# Patient Record
Sex: Female | Born: 1940 | Race: White | Hispanic: No | Smoking: Never smoker
Health system: Southern US, Community
[De-identification: ages and names within clinical notes are randomized; demographics above are authoritative.]

## PROBLEM LIST (undated history)

## (undated) DIAGNOSIS — C50919 Malignant neoplasm of unspecified site of unspecified female breast: Secondary | ICD-10-CM

## (undated) DIAGNOSIS — E785 Hyperlipidemia, unspecified: Secondary | ICD-10-CM

## (undated) DIAGNOSIS — F32A Depression, unspecified: Secondary | ICD-10-CM

## (undated) DIAGNOSIS — M1990 Unspecified osteoarthritis, unspecified site: Secondary | ICD-10-CM

## (undated) DIAGNOSIS — I10 Essential (primary) hypertension: Secondary | ICD-10-CM

## (undated) DIAGNOSIS — E119 Type 2 diabetes mellitus without complications: Secondary | ICD-10-CM

## (undated) DIAGNOSIS — M8580 Other specified disorders of bone density and structure, unspecified site: Secondary | ICD-10-CM

## (undated) DIAGNOSIS — K635 Polyp of colon: Secondary | ICD-10-CM

## (undated) HISTORY — DX: Unspecified osteoarthritis, unspecified site: M19.90

## (undated) HISTORY — DX: Other specified disorders of bone density and structure, unspecified site: M85.80

## (undated) HISTORY — DX: Essential (primary) hypertension: I10

## (undated) HISTORY — DX: Hyperlipidemia, unspecified: E78.5

## (undated) HISTORY — PX: APPENDECTOMY: SHX54

## (undated) HISTORY — PX: POLYPECTOMY: SHX149

## (undated) HISTORY — DX: Polyp of colon: K63.5

## (undated) HISTORY — DX: Type 2 diabetes mellitus without complications: E11.9

## (undated) HISTORY — PX: EYE SURGERY: SHX253

## (undated) HISTORY — DX: Malignant neoplasm of unspecified site of unspecified female breast: C50.919

---

## 2003-12-04 DIAGNOSIS — K635 Polyp of colon: Secondary | ICD-10-CM

## 2003-12-04 HISTORY — DX: Polyp of colon: K63.5

## 2004-09-28 ENCOUNTER — Ambulatory Visit: Payer: Self-pay | Admitting: Occupational Therapy

## 2004-11-04 ENCOUNTER — Ambulatory Visit: Payer: Self-pay | Admitting: Internal Medicine

## 2005-12-03 DIAGNOSIS — Z923 Personal history of irradiation: Secondary | ICD-10-CM

## 2005-12-03 DIAGNOSIS — C50919 Malignant neoplasm of unspecified site of unspecified female breast: Secondary | ICD-10-CM

## 2005-12-03 HISTORY — DX: Personal history of irradiation: Z92.3

## 2005-12-03 HISTORY — DX: Malignant neoplasm of unspecified site of unspecified female breast: C50.919

## 2005-12-03 HISTORY — PX: BREAST LUMPECTOMY: SHX2

## 2006-03-28 ENCOUNTER — Ambulatory Visit: Payer: Self-pay | Admitting: Nurse Practitioner

## 2006-04-08 ENCOUNTER — Ambulatory Visit: Payer: Self-pay | Admitting: Nurse Practitioner

## 2006-04-19 ENCOUNTER — Encounter (INDEPENDENT_AMBULATORY_CARE_PROVIDER_SITE_OTHER): Payer: Self-pay | Admitting: Specialist

## 2006-04-19 ENCOUNTER — Encounter: Admission: RE | Admit: 2006-04-19 | Discharge: 2006-04-19 | Payer: Self-pay | Admitting: Nurse Practitioner

## 2006-04-19 ENCOUNTER — Encounter (INDEPENDENT_AMBULATORY_CARE_PROVIDER_SITE_OTHER): Payer: Self-pay | Admitting: Radiology

## 2006-04-26 ENCOUNTER — Encounter: Admission: RE | Admit: 2006-04-26 | Discharge: 2006-04-26 | Payer: Self-pay | Admitting: General Surgery

## 2006-04-30 ENCOUNTER — Ambulatory Visit (HOSPITAL_BASED_OUTPATIENT_CLINIC_OR_DEPARTMENT_OTHER): Admission: RE | Admit: 2006-04-30 | Discharge: 2006-04-30 | Payer: Self-pay | Admitting: General Surgery

## 2006-04-30 ENCOUNTER — Encounter (INDEPENDENT_AMBULATORY_CARE_PROVIDER_SITE_OTHER): Payer: Self-pay | Admitting: *Deleted

## 2006-05-01 ENCOUNTER — Ambulatory Visit: Payer: Self-pay | Admitting: Oncology

## 2006-05-08 LAB — COMPREHENSIVE METABOLIC PANEL
ALT: 19 U/L (ref 0–40)
AST: 13 U/L (ref 0–37)
Albumin: 4.3 g/dL (ref 3.5–5.2)
CO2: 27 mEq/L (ref 19–32)
Calcium: 9.5 mg/dL (ref 8.4–10.5)
Chloride: 105 mEq/L (ref 96–112)
Potassium: 4 mEq/L (ref 3.5–5.3)
Sodium: 142 mEq/L (ref 135–145)
Total Protein: 6.7 g/dL (ref 6.0–8.3)

## 2006-05-08 LAB — CBC WITH DIFFERENTIAL/PLATELET
BASO%: 0.7 % (ref 0.0–2.0)
EOS%: 1.7 % (ref 0.0–7.0)
HCT: 43.6 % (ref 34.8–46.6)
MCHC: 33.8 g/dL (ref 32.0–36.0)
MONO#: 0.5 10*3/uL (ref 0.1–0.9)
NEUT%: 58.1 % (ref 39.6–76.8)
RBC: 4.87 10*6/uL (ref 3.70–5.32)
RDW: 13.2 % (ref 11.3–14.5)
WBC: 10 10*3/uL (ref 3.9–10.0)
lymph#: 3.4 10*3/uL — ABNORMAL HIGH (ref 0.9–3.3)

## 2006-05-08 LAB — CANCER ANTIGEN 27.29: CA 27.29: 12 U/mL (ref 0–39)

## 2006-05-13 ENCOUNTER — Ambulatory Visit (HOSPITAL_COMMUNITY): Admission: RE | Admit: 2006-05-13 | Discharge: 2006-05-13 | Payer: Self-pay | Admitting: Oncology

## 2006-05-14 ENCOUNTER — Encounter: Admission: RE | Admit: 2006-05-14 | Discharge: 2006-05-14 | Payer: Self-pay | Admitting: Oncology

## 2006-05-16 ENCOUNTER — Ambulatory Visit: Admission: RE | Admit: 2006-05-16 | Discharge: 2006-06-07 | Payer: Self-pay | Admitting: *Deleted

## 2006-05-20 ENCOUNTER — Ambulatory Visit (HOSPITAL_COMMUNITY): Admission: RE | Admit: 2006-05-20 | Discharge: 2006-05-20 | Payer: Self-pay | Admitting: Oncology

## 2006-06-06 LAB — CBC WITH DIFFERENTIAL/PLATELET
BASO%: 1.3 % (ref 0.0–2.0)
EOS%: 0.4 % (ref 0.0–7.0)
LYMPH%: 35 % (ref 14.0–48.0)
MCH: 30.4 pg (ref 26.0–34.0)
MCHC: 34.3 g/dL (ref 32.0–36.0)
MONO#: 0.5 10*3/uL (ref 0.1–0.9)
Platelets: 246 10*3/uL (ref 145–400)
RBC: 4.83 10*6/uL (ref 3.70–5.32)
WBC: 8.8 10*3/uL (ref 3.9–10.0)
lymph#: 3.1 10*3/uL (ref 0.9–3.3)

## 2006-06-08 ENCOUNTER — Encounter: Admission: RE | Admit: 2006-06-08 | Discharge: 2006-06-08 | Payer: Self-pay | Admitting: Oncology

## 2006-06-10 ENCOUNTER — Ambulatory Visit: Payer: Self-pay | Admitting: Oncology

## 2006-06-19 ENCOUNTER — Ambulatory Visit: Admission: RE | Admit: 2006-06-19 | Discharge: 2006-09-17 | Payer: Self-pay | Admitting: *Deleted

## 2006-09-05 ENCOUNTER — Ambulatory Visit: Payer: Self-pay | Admitting: Oncology

## 2007-01-03 ENCOUNTER — Ambulatory Visit: Payer: Self-pay | Admitting: Oncology

## 2007-01-07 LAB — COMPREHENSIVE METABOLIC PANEL
AST: 21 U/L (ref 0–37)
Albumin: 4.5 g/dL (ref 3.5–5.2)
Alkaline Phosphatase: 90 U/L (ref 39–117)
BUN: 16 mg/dL (ref 6–23)
Glucose, Bld: 128 mg/dL — ABNORMAL HIGH (ref 70–99)
Potassium: 3.6 mEq/L (ref 3.5–5.3)
Sodium: 142 mEq/L (ref 135–145)
Total Bilirubin: 0.7 mg/dL (ref 0.3–1.2)
Total Protein: 7 g/dL (ref 6.0–8.3)

## 2007-01-07 LAB — CBC WITH DIFFERENTIAL/PLATELET
EOS%: 0.3 % (ref 0.0–7.0)
Eosinophils Absolute: 0 10*3/uL (ref 0.0–0.5)
LYMPH%: 28.4 % (ref 14.0–48.0)
MCH: 30.9 pg (ref 26.0–34.0)
MCV: 87.6 fL (ref 81.0–101.0)
MONO%: 5.8 % (ref 0.0–13.0)
Platelets: 244 10*3/uL (ref 145–400)
RBC: 4.84 10*6/uL (ref 3.70–5.32)
RDW: 13.3 % (ref 11.3–14.5)

## 2007-01-07 LAB — CANCER ANTIGEN 27.29: CA 27.29: 16 U/mL (ref 0–39)

## 2007-03-31 ENCOUNTER — Ambulatory Visit: Payer: Self-pay

## 2007-05-05 ENCOUNTER — Ambulatory Visit: Payer: Self-pay | Admitting: Oncology

## 2007-05-07 LAB — COMPREHENSIVE METABOLIC PANEL
ALT: 19 U/L (ref 0–35)
BUN: 13 mg/dL (ref 6–23)
CO2: 29 mEq/L (ref 19–32)
Calcium: 9.8 mg/dL (ref 8.4–10.5)
Chloride: 104 mEq/L (ref 96–112)
Creatinine, Ser: 0.72 mg/dL (ref 0.40–1.20)
Glucose, Bld: 131 mg/dL — ABNORMAL HIGH (ref 70–99)
Total Bilirubin: 0.6 mg/dL (ref 0.3–1.2)

## 2007-05-07 LAB — CBC WITH DIFFERENTIAL/PLATELET
Basophils Absolute: 0 10*3/uL (ref 0.0–0.1)
HCT: 40.5 % (ref 34.8–46.6)
HGB: 14.2 g/dL (ref 11.6–15.9)
LYMPH%: 30.1 % (ref 14.0–48.0)
MONO#: 0.5 10*3/uL (ref 0.1–0.9)
NEUT%: 62.7 % (ref 39.6–76.8)
Platelets: 209 10*3/uL (ref 145–400)
WBC: 7.9 10*3/uL (ref 3.9–10.0)
lymph#: 2.4 10*3/uL (ref 0.9–3.3)

## 2007-05-07 LAB — LACTATE DEHYDROGENASE: LDH: 156 U/L (ref 94–250)

## 2007-05-07 LAB — CANCER ANTIGEN 27.29: CA 27.29: 13 U/mL (ref 0–39)

## 2007-08-14 ENCOUNTER — Ambulatory Visit: Payer: Self-pay | Admitting: Orthopedic Surgery

## 2007-09-12 ENCOUNTER — Ambulatory Visit: Payer: Self-pay | Admitting: Oncology

## 2007-09-16 LAB — COMPREHENSIVE METABOLIC PANEL
AST: 14 U/L (ref 0–37)
Albumin: 4.5 g/dL (ref 3.5–5.2)
Alkaline Phosphatase: 77 U/L (ref 39–117)
Chloride: 99 mEq/L (ref 96–112)
Glucose, Bld: 102 mg/dL — ABNORMAL HIGH (ref 70–99)
Potassium: 3.7 mEq/L (ref 3.5–5.3)
Sodium: 139 mEq/L (ref 135–145)
Total Protein: 6.9 g/dL (ref 6.0–8.3)

## 2007-09-16 LAB — CBC WITH DIFFERENTIAL/PLATELET
Eosinophils Absolute: 0 10*3/uL (ref 0.0–0.5)
MCV: 88.9 fL (ref 81.0–101.0)
MONO%: 5.6 % (ref 0.0–13.0)
NEUT#: 4.8 10*3/uL (ref 1.5–6.5)
RBC: 4.55 10*6/uL (ref 3.70–5.32)
RDW: 13.2 % (ref 11.3–14.5)
WBC: 7.5 10*3/uL (ref 3.9–10.0)
lymph#: 2.1 10*3/uL (ref 0.9–3.3)

## 2007-09-16 LAB — LACTATE DEHYDROGENASE: LDH: 179 U/L (ref 94–250)

## 2008-01-12 ENCOUNTER — Ambulatory Visit: Payer: Self-pay | Admitting: Oncology

## 2008-01-14 LAB — CBC WITH DIFFERENTIAL/PLATELET
BASO%: 0.3 % (ref 0.0–2.0)
HCT: 42 % (ref 34.8–46.6)
HGB: 14.5 g/dL (ref 11.6–15.9)
MCHC: 34.5 g/dL (ref 32.0–36.0)
MONO#: 0.3 10*3/uL (ref 0.1–0.9)
NEUT%: 68.4 % (ref 39.6–76.8)
RDW: 13.7 % (ref 11.3–14.5)
WBC: 8.1 10*3/uL (ref 3.9–10.0)
lymph#: 2.2 10*3/uL (ref 0.9–3.3)

## 2008-01-15 LAB — VITAMIN D 25 HYDROXY (VIT D DEFICIENCY, FRACTURES): Vit D, 25-Hydroxy: 38 ng/mL (ref 30–89)

## 2008-01-15 LAB — CANCER ANTIGEN 27.29: CA 27.29: 22 U/mL (ref 0–39)

## 2008-01-15 LAB — COMPREHENSIVE METABOLIC PANEL
ALT: 21 U/L (ref 0–35)
AST: 19 U/L (ref 0–37)
Albumin: 4.5 g/dL (ref 3.5–5.2)
Alkaline Phosphatase: 93 U/L (ref 39–117)
BUN: 18 mg/dL (ref 6–23)
CO2: 27 mEq/L (ref 19–32)
Calcium: 10.3 mg/dL (ref 8.4–10.5)
Chloride: 99 mEq/L (ref 96–112)
Creatinine, Ser: 0.87 mg/dL (ref 0.40–1.20)
Glucose, Bld: 125 mg/dL — ABNORMAL HIGH (ref 70–99)
Potassium: 3.8 mEq/L (ref 3.5–5.3)
Sodium: 141 mEq/L (ref 135–145)
Total Bilirubin: 0.6 mg/dL (ref 0.3–1.2)
Total Protein: 7 g/dL (ref 6.0–8.3)

## 2008-01-15 LAB — LACTATE DEHYDROGENASE: LDH: 168 U/L (ref 94–250)

## 2008-01-20 LAB — VITAMIN D 1,25 DIHYDROXY: Vit D, 1,25-Dihydroxy: 27 pg/mL (ref 6–62)

## 2008-05-11 ENCOUNTER — Ambulatory Visit: Payer: Self-pay | Admitting: Oncology

## 2008-05-14 LAB — CBC WITH DIFFERENTIAL/PLATELET
Basophils Absolute: 0 10*3/uL (ref 0.0–0.1)
Eosinophils Absolute: 0.1 10*3/uL (ref 0.0–0.5)
HCT: 43 % (ref 34.8–46.6)
HGB: 15 g/dL (ref 11.6–15.9)
MCV: 88.8 fL (ref 81.0–101.0)
MONO%: 6.5 % (ref 0.0–13.0)
NEUT#: 4.5 10*3/uL (ref 1.5–6.5)
NEUT%: 62 % (ref 39.6–76.8)
RDW: 13 % (ref 11.3–14.5)

## 2008-05-17 LAB — VITAMIN D 25 HYDROXY (VIT D DEFICIENCY, FRACTURES): Vit D, 25-Hydroxy: 46 ng/mL (ref 30–89)

## 2008-05-17 LAB — COMPREHENSIVE METABOLIC PANEL
Albumin: 4.5 g/dL (ref 3.5–5.2)
Alkaline Phosphatase: 88 U/L (ref 39–117)
BUN: 18 mg/dL (ref 6–23)
Calcium: 9.9 mg/dL (ref 8.4–10.5)
Chloride: 103 mEq/L (ref 96–112)
Creatinine, Ser: 0.65 mg/dL (ref 0.40–1.20)
Glucose, Bld: 143 mg/dL — ABNORMAL HIGH (ref 70–99)
Potassium: 3.7 mEq/L (ref 3.5–5.3)

## 2008-05-17 LAB — CANCER ANTIGEN 27.29: CA 27.29: 24 U/mL (ref 0–39)

## 2008-05-18 ENCOUNTER — Encounter: Admission: RE | Admit: 2008-05-18 | Discharge: 2008-05-18 | Payer: Self-pay | Admitting: Oncology

## 2008-09-15 ENCOUNTER — Ambulatory Visit: Payer: Self-pay | Admitting: Oncology

## 2008-09-17 LAB — CBC WITH DIFFERENTIAL/PLATELET
BASO%: 0.6 % (ref 0.0–2.0)
HCT: 43.3 % (ref 34.8–46.6)
LYMPH%: 35.8 % (ref 14.0–48.0)
MCH: 31 pg (ref 26.0–34.0)
MCHC: 34.6 g/dL (ref 32.0–36.0)
MCV: 89.4 fL (ref 81.0–101.0)
MONO#: 0.4 10*3/uL (ref 0.1–0.9)
NEUT%: 57.1 % (ref 39.6–76.8)
Platelets: 235 10*3/uL (ref 145–400)
WBC: 7.7 10*3/uL (ref 3.9–10.0)

## 2008-09-20 LAB — LACTATE DEHYDROGENASE: LDH: 168 U/L (ref 94–250)

## 2008-09-20 LAB — COMPREHENSIVE METABOLIC PANEL
ALT: 18 U/L (ref 0–35)
CO2: 27 mEq/L (ref 19–32)
Creatinine, Ser: 0.7 mg/dL (ref 0.40–1.20)
Total Bilirubin: 0.6 mg/dL (ref 0.3–1.2)

## 2008-09-20 LAB — CANCER ANTIGEN 27.29: CA 27.29: 25 U/mL (ref 0–39)

## 2009-03-23 ENCOUNTER — Ambulatory Visit: Payer: Self-pay | Admitting: Oncology

## 2009-03-25 LAB — CBC WITH DIFFERENTIAL/PLATELET
BASO%: 0.4 % (ref 0.0–2.0)
Basophils Absolute: 0 10e3/uL (ref 0.0–0.1)
EOS%: 0.6 % (ref 0.0–7.0)
Eosinophils Absolute: 0.1 10e3/uL (ref 0.0–0.5)
HCT: 43.3 % (ref 34.8–46.6)
HGB: 14.8 g/dL (ref 11.6–15.9)
LYMPH%: 32.1 % (ref 14.0–49.7)
MCH: 30.8 pg (ref 25.1–34.0)
MCHC: 34.3 g/dL (ref 31.5–36.0)
MCV: 89.8 fL (ref 79.5–101.0)
MONO#: 0.5 10e3/uL (ref 0.1–0.9)
MONO%: 5 % (ref 0.0–14.0)
NEUT#: 5.7 10e3/uL (ref 1.5–6.5)
NEUT%: 61.9 % (ref 38.4–76.8)
Platelets: 227 10e3/uL (ref 145–400)
RBC: 4.82 10e6/uL (ref 3.70–5.45)
RDW: 13.5 % (ref 11.2–14.5)
WBC: 9.2 10e3/uL (ref 3.9–10.3)
lymph#: 3 10e3/uL (ref 0.9–3.3)

## 2009-03-28 LAB — COMPREHENSIVE METABOLIC PANEL
ALT: 33 U/L (ref 0–35)
BUN: 14 mg/dL (ref 6–23)
CO2: 29 mEq/L (ref 19–32)
Creatinine, Ser: 0.88 mg/dL (ref 0.40–1.20)
Total Bilirubin: 0.3 mg/dL (ref 0.3–1.2)

## 2009-03-28 LAB — CANCER ANTIGEN 27.29: CA 27.29: 24 U/mL (ref 0–39)

## 2009-03-28 LAB — LACTATE DEHYDROGENASE: LDH: 180 U/L (ref 94–250)

## 2009-05-23 ENCOUNTER — Encounter: Admission: RE | Admit: 2009-05-23 | Discharge: 2009-05-23 | Payer: Self-pay | Admitting: Oncology

## 2009-12-03 HISTORY — PX: VAGINAL HYSTERECTOMY: SUR661

## 2009-12-09 ENCOUNTER — Other Ambulatory Visit: Admission: RE | Admit: 2009-12-09 | Discharge: 2009-12-09 | Payer: Self-pay | Admitting: Obstetrics and Gynecology

## 2010-03-01 ENCOUNTER — Encounter: Payer: Self-pay | Admitting: Obstetrics & Gynecology

## 2010-03-01 ENCOUNTER — Inpatient Hospital Stay (HOSPITAL_COMMUNITY): Admission: RE | Admit: 2010-03-01 | Discharge: 2010-03-03 | Payer: Self-pay | Admitting: Obstetrics & Gynecology

## 2010-03-23 ENCOUNTER — Ambulatory Visit: Payer: Self-pay | Admitting: Oncology

## 2010-03-24 LAB — CBC WITH DIFFERENTIAL/PLATELET
BASO%: 0.4 % (ref 0.0–2.0)
Eosinophils Absolute: 0.2 10*3/uL (ref 0.0–0.5)
HCT: 37.1 % (ref 34.8–46.6)
MCH: 31.5 pg (ref 25.1–34.0)
MCHC: 34 g/dL (ref 31.5–36.0)
NEUT#: 6.8 10*3/uL — ABNORMAL HIGH (ref 1.5–6.5)
Platelets: 298 10*3/uL (ref 145–400)
RDW: 13.6 % (ref 11.2–14.5)

## 2010-03-24 LAB — CANCER ANTIGEN 27.29: CA 27.29: 12 U/mL (ref 0–39)

## 2010-03-24 LAB — COMPREHENSIVE METABOLIC PANEL
BUN: 11 mg/dL (ref 6–23)
Chloride: 104 mEq/L (ref 96–112)
Creatinine, Ser: 0.65 mg/dL (ref 0.40–1.20)
Glucose, Bld: 101 mg/dL — ABNORMAL HIGH (ref 70–99)
Potassium: 4.1 mEq/L (ref 3.5–5.3)
Sodium: 143 mEq/L (ref 135–145)
Total Protein: 6.4 g/dL (ref 6.0–8.3)

## 2010-03-24 LAB — LACTATE DEHYDROGENASE: LDH: 110 U/L (ref 94–250)

## 2010-06-02 ENCOUNTER — Encounter: Admission: RE | Admit: 2010-06-02 | Discharge: 2010-06-02 | Payer: Self-pay | Admitting: Oncology

## 2011-02-21 LAB — CBC
HCT: 28.7 % — ABNORMAL LOW (ref 36.0–46.0)
Hemoglobin: 10.3 g/dL — ABNORMAL LOW (ref 12.0–15.0)
MCV: 90.5 fL (ref 78.0–100.0)
RBC: 3.17 MIL/uL — ABNORMAL LOW (ref 3.87–5.11)

## 2011-02-21 LAB — DIFFERENTIAL
Lymphs Abs: 1.2 10*3/uL (ref 0.7–4.0)
Monocytes Absolute: 0.6 10*3/uL (ref 0.1–1.0)
Neutro Abs: 9.8 10*3/uL — ABNORMAL HIGH (ref 1.7–7.7)

## 2011-02-26 LAB — COMPREHENSIVE METABOLIC PANEL
ALT: 21 U/L (ref 0–35)
AST: 17 U/L (ref 0–37)
Alkaline Phosphatase: 61 U/L (ref 39–117)
CO2: 29 mEq/L (ref 19–32)
Creatinine, Ser: 0.65 mg/dL (ref 0.4–1.2)
GFR calc Af Amer: >60 mL/min (ref >60)
Glucose, Bld: 104 mg/dL — ABNORMAL HIGH (ref 70–99)
Potassium: 4.1 mEq/L (ref 3.5–5.1)
Total Protein: 6.3 g/dL (ref 6.0–8.3)

## 2011-02-26 LAB — URINALYSIS, ROUTINE W REFLEX MICROSCOPIC
Hgb urine dipstick: NEGATIVE
Ketones, ur: NEGATIVE mg/dL
Specific Gravity, Urine: 1.015 (ref 1.005–1.030)
Urobilinogen, UA: 0.2 mg/dL (ref 0.0–1.0)

## 2011-02-26 LAB — DIFFERENTIAL
Basophils Absolute: 0 10*3/uL (ref 0.0–0.1)
Eosinophils Absolute: 0 10*3/uL (ref 0.0–0.7)
Eosinophils Relative: 0 % (ref 0–5)
Lymphocytes Relative: 9 % — ABNORMAL LOW (ref 12–46)

## 2011-02-26 LAB — ABO/RH: ABO/RH(D): O POS

## 2011-02-26 LAB — CBC
HCT: 28.4 % — ABNORMAL LOW (ref 36.0–46.0)
HCT: 41 % (ref 36.0–46.0)
Hemoglobin: 14.3 g/dL (ref 12.0–15.0)
MCV: 91.1 fL (ref 78.0–100.0)
Platelets: 155 10*3/uL (ref 150–400)
RDW: 13.3 % (ref 11.5–15.5)
RDW: 13.4 % (ref 11.5–15.5)
WBC: 8.9 10*3/uL (ref 4.0–10.5)

## 2011-02-26 LAB — CROSSMATCH

## 2011-02-26 LAB — GLUCOSE, CAPILLARY: Glucose-Capillary: 118 mg/dL — ABNORMAL HIGH (ref 70–99)

## 2011-04-10 ENCOUNTER — Other Ambulatory Visit: Payer: Self-pay | Admitting: Oncology

## 2011-04-10 ENCOUNTER — Encounter (HOSPITAL_BASED_OUTPATIENT_CLINIC_OR_DEPARTMENT_OTHER): Payer: Medicare Other | Admitting: Oncology

## 2011-04-10 ENCOUNTER — Ambulatory Visit: Payer: Self-pay | Admitting: Gastroenterology

## 2011-04-10 ENCOUNTER — Ambulatory Visit: Payer: Self-pay | Admitting: Urgent Care

## 2011-04-10 DIAGNOSIS — C50919 Malignant neoplasm of unspecified site of unspecified female breast: Secondary | ICD-10-CM

## 2011-04-10 DIAGNOSIS — Z170 Estrogen receptor positive status [ER+]: Secondary | ICD-10-CM

## 2011-04-10 LAB — COMPREHENSIVE METABOLIC PANEL
ALT: 18 U/L (ref 0–35)
CO2: 26 mEq/L (ref 19–32)
Calcium: 10 mg/dL (ref 8.4–10.5)
Chloride: 102 mEq/L (ref 96–112)
Potassium: 3.8 mEq/L (ref 3.5–5.3)
Sodium: 140 mEq/L (ref 135–145)
Total Protein: 6.5 g/dL (ref 6.0–8.3)

## 2011-04-10 LAB — CBC WITH DIFFERENTIAL/PLATELET
BASO%: 0.2 % (ref 0.0–2.0)
HCT: 40.6 % (ref 34.8–46.6)
MCHC: 34 g/dL (ref 31.5–36.0)
MONO#: 0.5 10*3/uL (ref 0.1–0.9)
RBC: 4.47 10*6/uL (ref 3.70–5.45)
WBC: 8.2 10*3/uL (ref 3.9–10.3)
lymph#: 2.3 10*3/uL (ref 0.9–3.3)

## 2011-04-10 LAB — LACTATE DEHYDROGENASE: LDH: 194 U/L (ref 94–250)

## 2011-04-17 ENCOUNTER — Ambulatory Visit (INDEPENDENT_AMBULATORY_CARE_PROVIDER_SITE_OTHER): Payer: PRIVATE HEALTH INSURANCE | Admitting: Gastroenterology

## 2011-04-17 ENCOUNTER — Encounter: Payer: Self-pay | Admitting: Gastroenterology

## 2011-04-17 VITALS — BP 129/72 | HR 71 | Temp 98.1°F | Ht 62.0 in | Wt 158.6 lb

## 2011-04-17 DIAGNOSIS — Z86010 Personal history of colonic polyps: Secondary | ICD-10-CM

## 2011-04-17 DIAGNOSIS — Z853 Personal history of malignant neoplasm of breast: Secondary | ICD-10-CM

## 2011-04-17 MED ORDER — PEG 3350-KCL-NA BICARB-NACL 420 G PO SOLR: ORAL | Status: AC

## 2011-04-17 NOTE — Assessment & Plan Note (Signed)
History of colonic polyps in 2005. Personal history of breast cancer. Sister with history of other female cancer. Recommend surveillance colonoscopy at this time. I have discussed the risks, alternatives, benefits with regards to but not limited to the risk of reaction to medication, bleeding, infection, perforation and the patient is agreeable to proceed. Written consent to be obtained.

## 2011-04-17 NOTE — Progress Notes (Signed)
Primary Care Physician:  Ninfa Linden, NP, NP  Primary Gastroenterologist:  Roetta Sessions, MD  Chief Complaint  Patient presents with  . Colonoscopy    h/o colon polyps    HPI:  Kalynn Declercq is a 70 y.o. female here to schedule surveillance colonoscopy. She had a colonoscopy in 2005 at which time she was found to have 3 colon polyps. Procedure was done at [facility]. She has chronic constipation which she manages with her diet. Denies any melena, rectal bleeding, abdominal pain, nausea, vomiting, weight loss, anorexia, heartburn, dysphagia.  Current Outpatient Prescriptions  Medication Sig Dispense Refill  . alendronate (FOSAMAX) 70 MG tablet Take 70 mg by mouth every 7 (seven) days. Take with a full glass of water on an empty stomach.       Marland Kitchen anastrozole (ARIMIDEX) 1 MG tablet Take 1 mg by mouth daily.        Marland Kitchen aspirin 325 MG tablet Take 325 mg by mouth daily.        Marland Kitchen atorvastatin (LIPITOR) 40 MG tablet Take 40 mg by mouth daily.        . Calcium Carbonate (CALTRATE 600 PO) Take by mouth.        Marland Kitchen CALCIUM-VITAMIN D PO Take by mouth.        . enalapril (VASOTEC) 5 MG tablet Take 5 mg by mouth daily.        . metFORMIN (GLUCOPHAGE) 500 MG tablet Take 500 mg by mouth 2 (two) times daily with a meal.        . oxybutynin (DITROPAN-XL) 10 MG 24 hr tablet Take 10 mg by mouth daily.          Allergies as of 04/17/2011  . (No Known Allergies)    Past Medical History  Diagnosis Date  . HTN (hypertension)   . Hyperlipidemia   . DM (diabetes mellitus)   . Osteoarthritis   . Breast cancer 2007    lumpectomy, XRT, Arimadex  . Colon polyps 2005  . Vitamin D deficiency   . Osteopenia     Past Surgical History  Procedure Date  . Appendectomy   . Vaginal hysterectomy 2011    partial  . Breast lumpectomy 2007    Family History  Problem Relation Age of Onset  . Diabetes Mother   . Heart disease Father   . Colon cancer Neg Hx   . Liver disease Neg Hx   . Stroke Mother     . Prostate cancer Brother   . Cancer Sister     female    History   Social History  . Marital Status: Widowed    Spouse Name: N/A    Number of Children: 4  . Years of Education: N/A   Occupational History  . Orthoptist    Social History Main Topics  . Smoking status: Never Smoker   . Smokeless tobacco: Not on file  . Alcohol Use: No  . Drug Use: No  . Sexually Active: Not on file      ROS:  General: Negative for anorexia, weight loss, fever, chills, fatigue, weakness. Eyes: Negative for vision changes.  ENT: Negative for hoarseness, difficulty swallowing , nasal congestion. CV: Negative for chest pain, angina, palpitations, dyspnea on exertion, peripheral edema.  Respiratory: Negative for dyspnea at rest, dyspnea on exertion, cough, sputum, wheezing.  GI: See history of present illness. GU:  Negative for dysuria, hematuria, urinary incontinence, urinary frequency, nocturnal urination.  MS: Negative for joint pain, low back  pain.  Derm: Negative for rash or itching.  Neuro: Negative for weakness, abnormal sensation, seizure, frequent headaches, memory loss, confusion.  Psych: Negative for anxiety, depression, suicidal ideation, hallucinations.  Endo: Negative for unusual weight change.  Heme: Negative for bruising or bleeding. Allergy: Negative for rash or hives.    Physical Examination:  BP 129/72  Pulse 71  Temp(Src) 98.1 F (36.7 C) (Temporal)  Ht 5\' 2"  (1.575 m)  Wt 158 lb 9.6 oz (71.94 kg)  BMI 29.01 kg/m2   General: Well-nourished, well-developed in no acute distress.  Head: Normocephalic, atraumatic.   Eyes: Conjunctiva pink, no icterus. Mouth: Oropharyngeal mucosa moist and pink , no lesions erythema or exudate. Neck: Supple without thyromegaly, masses, or lymphadenopathy.  Lungs: Clear to auscultation bilaterally.  Heart: Regular rate and rhythm, no murmurs rubs or gallops.  Abdomen: Bowel sounds are normal, nontender, nondistended, no  hepatosplenomegaly or masses, no abdominal bruits or    hernia , no rebound or guarding.   Extremities: No lower extremity edema.  Neuro: Alert and oriented x 4 , grossly normal neurologically.  Skin: Warm and dry, no rash or jaundice.   Psych: Alert and cooperative, normal mood and affect.

## 2011-04-17 NOTE — Progress Notes (Signed)
Cc to PCP 

## 2011-04-20 NOTE — Op Note (Signed)
NAMEROBERTO, Brenda Landry                 ACCOUNT NO.:  [id number]   MEDICAL RECORD NO.:  [id number]          PATIENT TYPE:  AMB   LOCATION:  DSC                          FACILITY:  [facility]   PHYSICIAN:  Rose Phi. Maple Hudson, M.D.   DATE OF BIRTH:  [birth year]   DATE OF PROCEDURE:  DATE OF DISCHARGE:                                 OPERATIVE REPORT   PREOPERATIVE DIAGNOSIS:  Stage I carcinoma right breast.   POSTOPERATIVE DIAGNOSIS:  Stage I carcinoma right breast.   OPERATION:  1.  Blue dye injection.  2.  Right axillary sentinel lymph node biopsy.  3.  Right partial mastectomy.   SURGEON:  Dr. Francina Ames.   ANESTHESIA:  General.   OPERATIVE PROCEDURE:  Prior to coming to the operating room, 1 mCi of  technetium sulfur colloid was injected intradermally in the right breast.   After suitable general anesthesia was induced the patient placed in supine  position with the arms extended on the arm board.  The right breast was  prepped and draped in a standard fashion after having injected 5 mL of a  mixture of 2 mL of methylene blue and 3 mL of injectable saline in the  subareolar tissue and the breast gently massaged for 3 minutes.  With her  prepped and draped a short transverse axillary incision was made with  dissection through subcutaneous tissue to the clavipectoral fascia.  Deep to  the fascia was one large blue and hot lymph node which I excised as a  sentinel node.  I could not palpate any nodes.  There were no other blue or  hot nodes.   While the sentinel node was being evaluated by the pathologist, a curved  incision including an ellipse of skin was made overlying the palpable nodule  10 o'clock position.  With we then did a wide excision down to the chest  wall and then submitted this, after orienting it for the pathologist, for  touch prep evaluation of margins.   While that was all being done, I mobilized the gland off of the chest wall  and closed the deep layer with  interrupted 3-0 Vicryl and the skin with a  subcuticular 4-0 Monocryl and Steri-Strips.  The axillary incision, after  being injected with local anesthetic was also closed in two layers of 3-0  Vicryl and subcuticular 4-0 Monocryl and Steri-Strips.   Touch prep evaluation of sentinel node was negative for metastatic disease  and touch prep of the margins was very clear with the closest margin being  the deep margin at 5 mm.  Dressings were then applied and the patient  transferred to the recovery room in satisfactory condition having tolerated  the procedure well.      Rose Phi. Maple Hudson, M.D.  Electronically Signed     PRY/MEDQ  D:  04/30/2006  T:  04/30/2006  Job:  [id number]

## 2011-04-25 ENCOUNTER — Encounter: Payer: PRIVATE HEALTH INSURANCE | Admitting: Internal Medicine

## 2011-04-25 ENCOUNTER — Ambulatory Visit (HOSPITAL_COMMUNITY)
Admission: RE | Admit: 2011-04-25 | Discharge: 2011-04-25 | Disposition: A | Discharge status: Home / Self Care | Payer: PRIVATE HEALTH INSURANCE | Source: Ambulatory Visit | Attending: Internal Medicine | Admitting: Internal Medicine

## 2011-04-25 DIAGNOSIS — K5730 Diverticulosis of large intestine without perforation or abscess without bleeding: Secondary | ICD-10-CM | POA: Insufficient documentation

## 2011-04-25 DIAGNOSIS — I10 Essential (primary) hypertension: Secondary | ICD-10-CM | POA: Insufficient documentation

## 2011-04-25 DIAGNOSIS — Z860100 Personal history of colon polyps, unspecified: Secondary | ICD-10-CM | POA: Insufficient documentation

## 2011-04-25 DIAGNOSIS — E119 Type 2 diabetes mellitus without complications: Secondary | ICD-10-CM | POA: Insufficient documentation

## 2011-04-25 DIAGNOSIS — Z7982 Long term (current) use of aspirin: Secondary | ICD-10-CM | POA: Insufficient documentation

## 2011-04-25 DIAGNOSIS — Z09 Encounter for follow-up examination after completed treatment for conditions other than malignant neoplasm: Secondary | ICD-10-CM

## 2011-04-25 DIAGNOSIS — E785 Hyperlipidemia, unspecified: Secondary | ICD-10-CM | POA: Insufficient documentation

## 2011-04-25 DIAGNOSIS — Z79899 Other long term (current) drug therapy: Secondary | ICD-10-CM | POA: Insufficient documentation

## 2011-04-27 NOTE — Op Note (Signed)
  NAME:  Brenda Landry, Brenda Landry                 ACCOUNT NO.:  [id number]  MEDICAL RECORD NO.:  [id number]           PATIENT TYPE:  O  LOCATION:  DAYP                          FACILITY:  [facility]  PHYSICIAN:  R. Roetta Sessions, M.D. DATE OF BIRTH:  [birth year]  DATE OF PROCEDURE:  04/25/2011 DATE OF DISCHARGE:                              OPERATIVE REPORT   PROCEDURE:  High-risk screening colonoscopy.  INDICATIONS FOR PROCEDURE:  A 70 year old lady with a history of multiple colonies removed from her colon in 2005, down at [facility], path unknown, however.  She was told to have her early followup examination.  She has no lower GI tract symptoms at this time.  She is here now for a surveillance examination.  Risks, benefits, limitations, alternatives, and imponderables have been discussed, questions were answered.  Please see documentation medical record.  PROCEDURE NOTE:  O2 saturation, blood pressure, pulse, and respirations monitored throughout the entirety of the procedure.  CONSCIOUS SEDATION:  Versed 6 mg IV and Demerol 75 mg IV in divided doses.  INSTRUMENT:  Pentax video chip system.  FINDINGS:  Digital rectal exam revealed no abnormalities.  Endoscopic findings, prep was adequate.  Colon:  Colonic mucosa was surveyed from the rectosigmoid junction through the left transverse right colon to the appendiceal orifice, ileocecal valve/cecum.  These structures were well seen and photographed for the record.  From this level, the scope was slowly and cautiously withdrawn.  All previously mentioned mucosal surfaces were again seen.  She had extensive scattered left-sided transverse diverticula.  However, the remainder of the colonic mucosa appeared normal.  Scope was pulled down the rectum where thorough examination of the rectal mucosa including retroflex view of the anal verge demonstrated no abnormalities.  Cecal withdrawal time 12 minutes. The patient tolerated the  procedure well and was reactive in endoscopy.  IMPRESSION:  Normal rectum, left-sided transverse diverticulum, remained colonic appeared normal.  RECOMMENDATIONS: 1. Diverticulosis literature provided to Ms. Doroteo Glassman. 2. Recommended repeat colonoscopy in 5 years.     Jonathon Bellows, M.D.     RMR/MEDQ  D:  04/25/2011  T:  04/25/2011  Job:  [id number]  cc:   Ninfa Linden, FNP Fax: [phone]  Electronically Signed by Lorrin Goodell M.D. on 04/27/2011 01:48:34 PM

## 2011-06-26 ENCOUNTER — Other Ambulatory Visit: Payer: Self-pay | Admitting: Oncology

## 2011-06-26 DIAGNOSIS — Z853 Personal history of malignant neoplasm of breast: Secondary | ICD-10-CM

## 2011-07-06 ENCOUNTER — Ambulatory Visit
Admission: RE | Admit: 2011-07-06 | Discharge: 2011-07-06 | Disposition: A | Discharge status: Home / Self Care | Payer: PRIVATE HEALTH INSURANCE | Source: Ambulatory Visit | Attending: Oncology | Admitting: Oncology

## 2011-07-06 ENCOUNTER — Other Ambulatory Visit: Payer: Self-pay | Admitting: Oncology

## 2011-07-06 DIAGNOSIS — Z853 Personal history of malignant neoplasm of breast: Secondary | ICD-10-CM

## 2011-12-03 ENCOUNTER — Other Ambulatory Visit: Payer: Self-pay | Admitting: Oncology

## 2011-12-03 DIAGNOSIS — R922 Inconclusive mammogram: Secondary | ICD-10-CM

## 2012-01-14 ENCOUNTER — Ambulatory Visit
Admission: RE | Admit: 2012-01-14 | Discharge: 2012-01-14 | Disposition: A | Discharge status: Home / Self Care | Payer: PRIVATE HEALTH INSURANCE | Source: Ambulatory Visit | Attending: Oncology | Admitting: Oncology

## 2012-01-14 ENCOUNTER — Other Ambulatory Visit: Payer: Self-pay | Admitting: Oncology

## 2012-01-14 DIAGNOSIS — R922 Inconclusive mammogram: Secondary | ICD-10-CM

## 2012-04-07 ENCOUNTER — Telehealth: Payer: Self-pay | Admitting: *Deleted

## 2012-04-07 NOTE — Telephone Encounter (Signed)
per md being out of the office moved to 05-28-2012 at 2:00pm left voice message to inform the patient of the new date and time

## 2012-04-09 ENCOUNTER — Ambulatory Visit: Payer: PRIVATE HEALTH INSURANCE | Admitting: Oncology

## 2012-04-09 ENCOUNTER — Other Ambulatory Visit: Payer: PRIVATE HEALTH INSURANCE | Admitting: Lab

## 2012-05-28 ENCOUNTER — Ambulatory Visit: Payer: PRIVATE HEALTH INSURANCE | Admitting: Oncology

## 2012-05-28 ENCOUNTER — Other Ambulatory Visit: Payer: PRIVATE HEALTH INSURANCE | Admitting: Lab

## 2012-05-28 NOTE — Progress Notes (Signed)
[facility] today.  Letter mailed to patient.  

## 2012-06-10 ENCOUNTER — Other Ambulatory Visit: Payer: Self-pay | Admitting: Obstetrics & Gynecology

## 2012-06-10 DIAGNOSIS — Z853 Personal history of malignant neoplasm of breast: Secondary | ICD-10-CM

## 2012-06-10 DIAGNOSIS — Z9889 Other specified postprocedural states: Secondary | ICD-10-CM

## 2012-07-09 ENCOUNTER — Other Ambulatory Visit: Payer: Self-pay | Admitting: Obstetrics & Gynecology

## 2012-07-09 ENCOUNTER — Ambulatory Visit
Admission: RE | Admit: 2012-07-09 | Discharge: 2012-07-09 | Disposition: A | Discharge status: Home / Self Care | Payer: PRIVATE HEALTH INSURANCE | Source: Ambulatory Visit | Attending: Obstetrics & Gynecology | Admitting: Obstetrics & Gynecology

## 2012-07-09 DIAGNOSIS — Z853 Personal history of malignant neoplasm of breast: Secondary | ICD-10-CM

## 2012-07-09 DIAGNOSIS — Z9889 Other specified postprocedural states: Secondary | ICD-10-CM

## 2012-07-16 ENCOUNTER — Ambulatory Visit
Admission: RE | Admit: 2012-07-16 | Discharge: 2012-07-16 | Disposition: A | Discharge status: Home / Self Care | Payer: PRIVATE HEALTH INSURANCE | Source: Ambulatory Visit | Attending: Obstetrics & Gynecology | Admitting: Obstetrics & Gynecology

## 2012-07-16 ENCOUNTER — Other Ambulatory Visit: Payer: Self-pay | Admitting: Obstetrics & Gynecology

## 2012-07-16 DIAGNOSIS — Z9889 Other specified postprocedural states: Secondary | ICD-10-CM

## 2012-07-16 DIAGNOSIS — Z853 Personal history of malignant neoplasm of breast: Secondary | ICD-10-CM

## 2012-07-16 HISTORY — PX: BREAST BIOPSY: SHX20

## 2012-07-22 ENCOUNTER — Other Ambulatory Visit: Payer: PRIVATE HEALTH INSURANCE

## 2012-12-16 ENCOUNTER — Other Ambulatory Visit: Payer: Self-pay | Admitting: Obstetrics & Gynecology

## 2012-12-16 DIAGNOSIS — N6320 Unspecified lump in the left breast, unspecified quadrant: Secondary | ICD-10-CM

## 2013-01-08 ENCOUNTER — Ambulatory Visit
Admission: RE | Admit: 2013-01-08 | Discharge: 2013-01-08 | Disposition: A | Discharge status: Home / Self Care | Payer: PRIVATE HEALTH INSURANCE | Source: Ambulatory Visit | Attending: Obstetrics & Gynecology | Admitting: Obstetrics & Gynecology

## 2013-01-08 DIAGNOSIS — N6320 Unspecified lump in the left breast, unspecified quadrant: Secondary | ICD-10-CM

## 2013-02-10 IMAGING — MG MM DIAGNOSTIC UNILATERAL L
2 series · 2 of 2 positions shown · non-contrast
Comparison: Previous exams.

CLINICAL DATA: Evaluate clip placement following ultrasound guided
left breast biopsy.

DIGITAL DIAGNOSTIC LEFT MAMMOGRAM

[L CC]
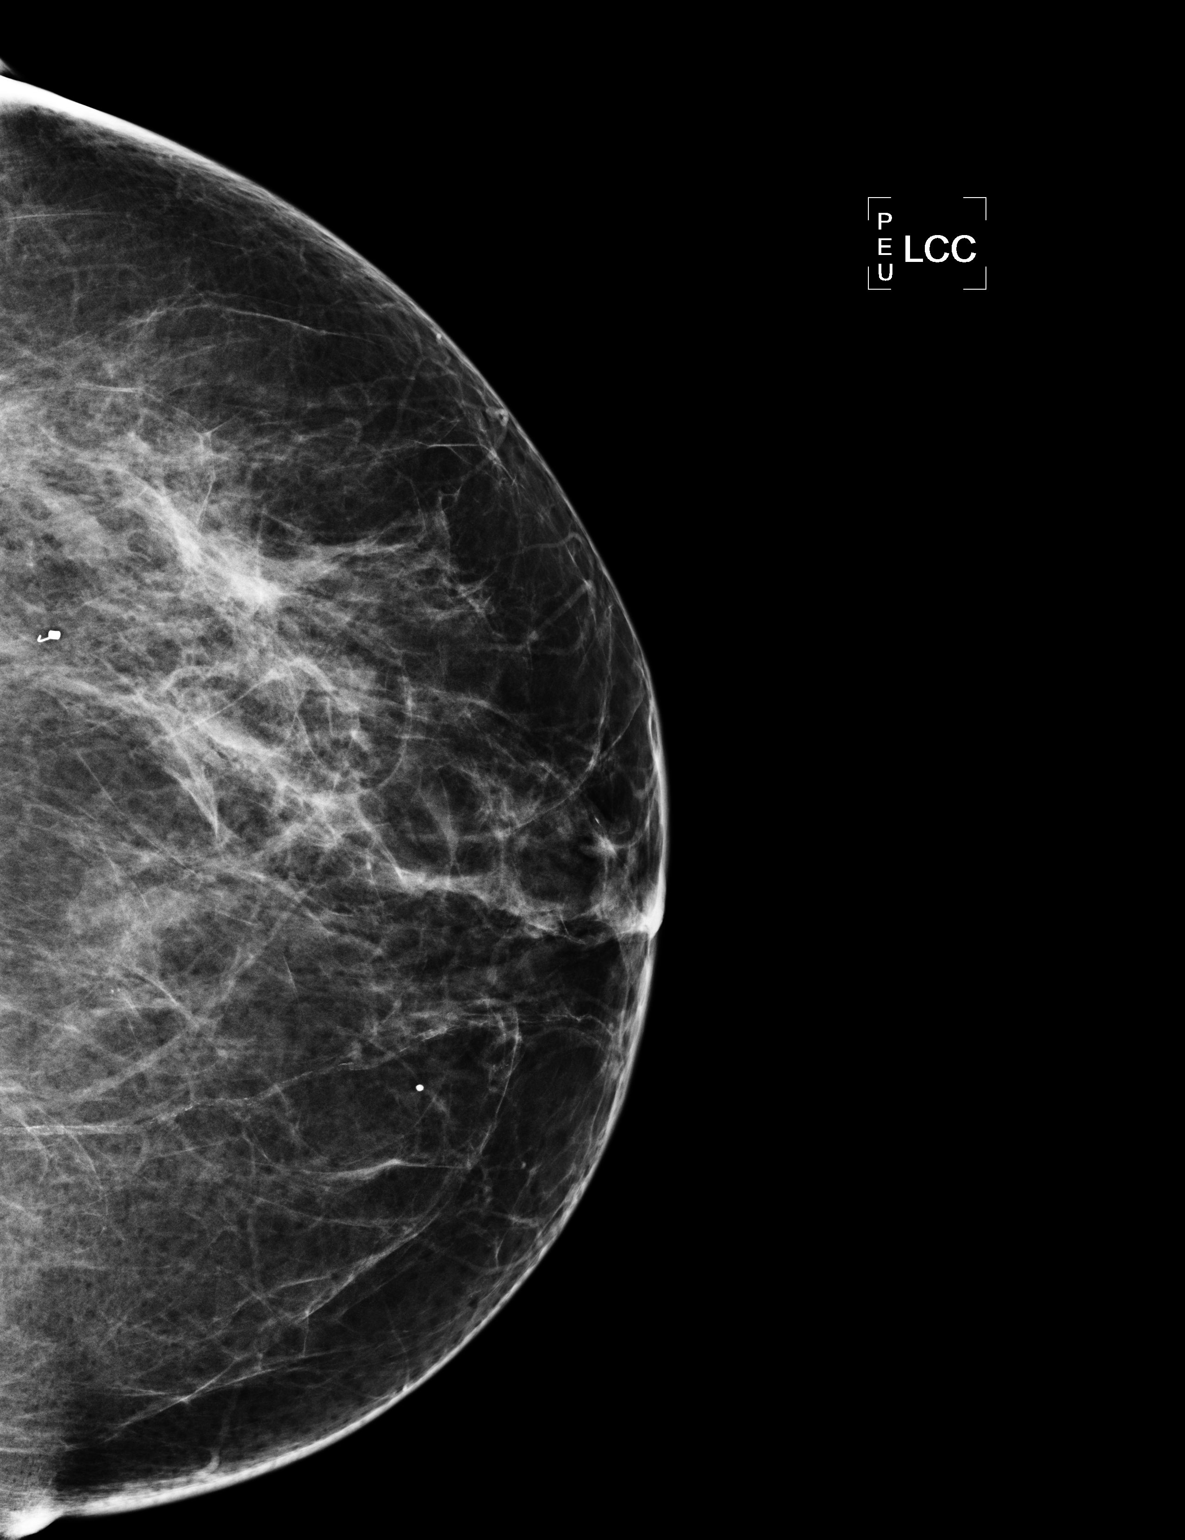

[L ML]
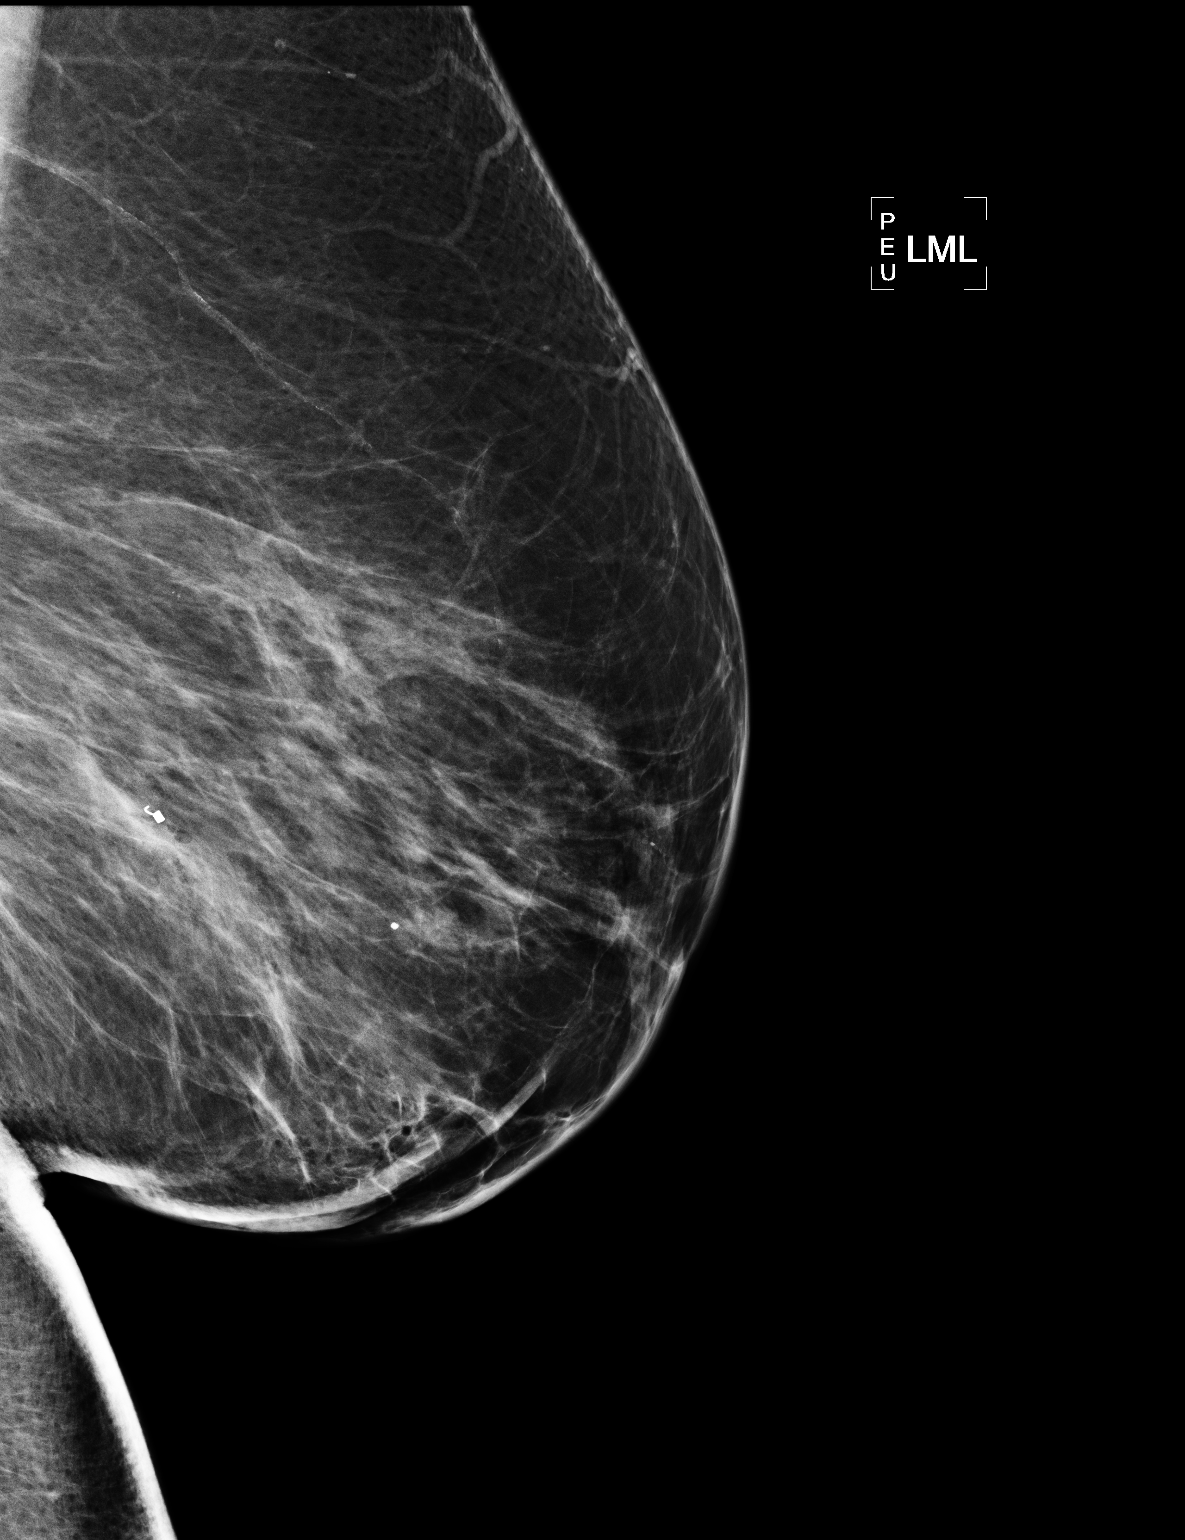

[2 of 2 positions shown; findings below may reference images not displayed]

FINDINGS: Films are performed following ultrasound guided biopsy
of the 5 x 9 x 12 mm oval hypoechoic area at the [DATE] position of
the left breast 7 cm from the nipple.  The coil shaped biopsy clip
is slightly anterior to the mammographic abnormality.
IMPRESSION: Biopsy clip slightly anterior to the mammographic abnormality.

Pathology will be followed.

## 2013-06-12 ENCOUNTER — Other Ambulatory Visit: Payer: Self-pay | Admitting: Nurse Practitioner

## 2013-06-12 DIAGNOSIS — N6320 Unspecified lump in the left breast, unspecified quadrant: Secondary | ICD-10-CM

## 2013-07-14 ENCOUNTER — Other Ambulatory Visit: Payer: Self-pay | Admitting: Nurse Practitioner

## 2013-07-14 ENCOUNTER — Ambulatory Visit
Admission: RE | Admit: 2013-07-14 | Discharge: 2013-07-14 | Disposition: A | Discharge status: Home / Self Care | Payer: PRIVATE HEALTH INSURANCE | Source: Ambulatory Visit | Attending: Nurse Practitioner | Admitting: Nurse Practitioner

## 2013-07-14 DIAGNOSIS — N6320 Unspecified lump in the left breast, unspecified quadrant: Secondary | ICD-10-CM

## 2013-07-21 ENCOUNTER — Ambulatory Visit
Admission: RE | Admit: 2013-07-21 | Discharge: 2013-07-21 | Disposition: A | Discharge status: Home / Self Care | Payer: PRIVATE HEALTH INSURANCE | Source: Ambulatory Visit | Attending: Nurse Practitioner | Admitting: Nurse Practitioner

## 2013-07-21 ENCOUNTER — Other Ambulatory Visit: Payer: Self-pay | Admitting: Nurse Practitioner

## 2013-07-21 DIAGNOSIS — N6002 Solitary cyst of left breast: Secondary | ICD-10-CM

## 2013-07-21 DIAGNOSIS — N6320 Unspecified lump in the left breast, unspecified quadrant: Secondary | ICD-10-CM

## 2014-02-08 IMAGING — MG MM DIAGNOSTIC BILATERAL
7 series · 7 of 7 positions shown · non-contrast
Comparison: Multiple priors

CLINICAL DATA: Annual examined follow-up left breast mass.

DIGITAL DIAGNOSTIC BILATERAL MAMMOGRAM WITH CAD AND LEFT BREAST
ULTRASOUND:

[R CC]
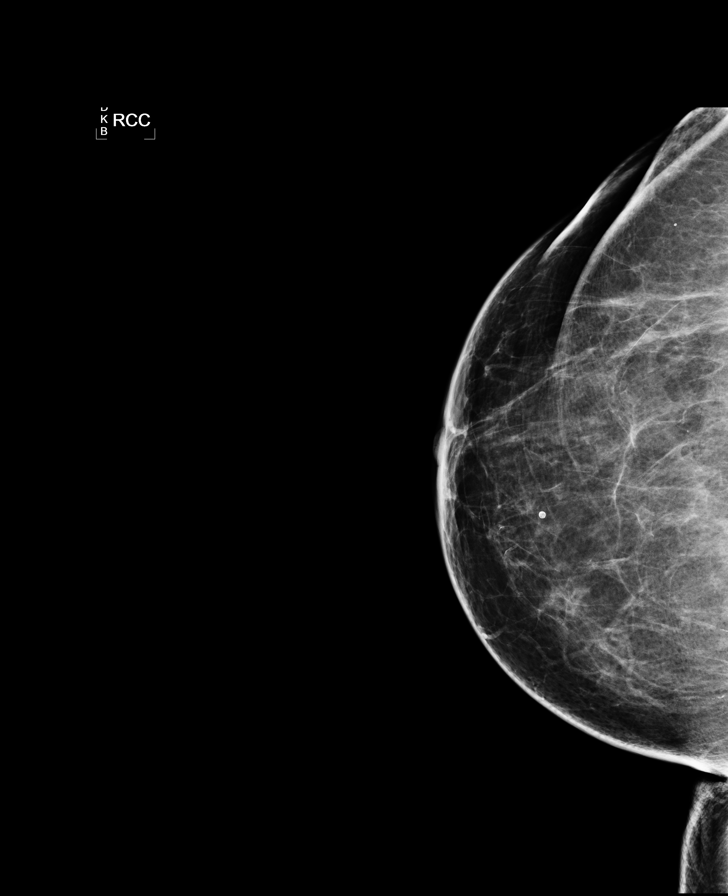

[L CC (1 of 2)]
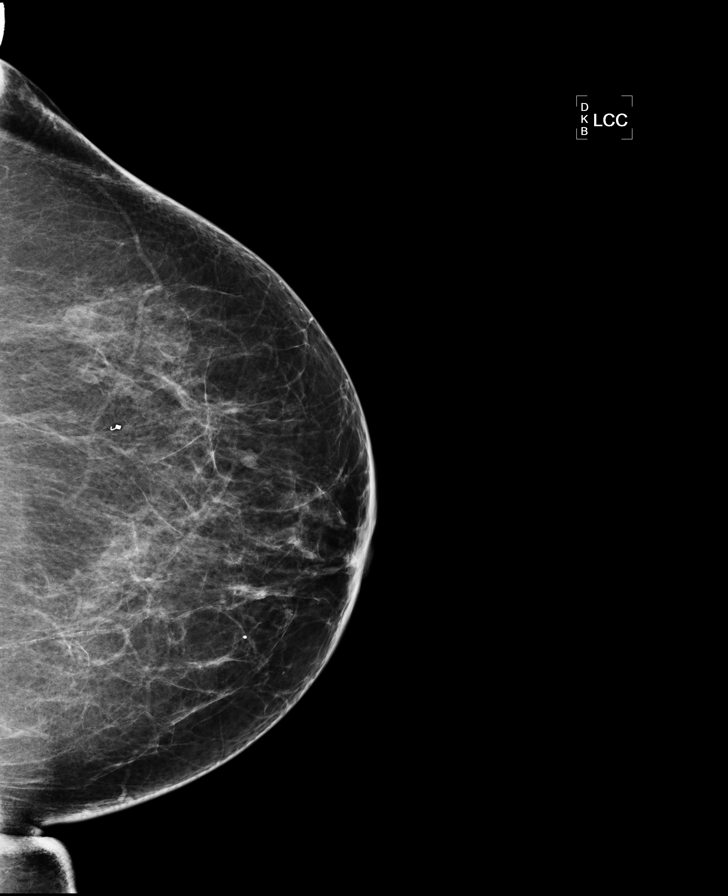

[L MLO (1 of 2)]
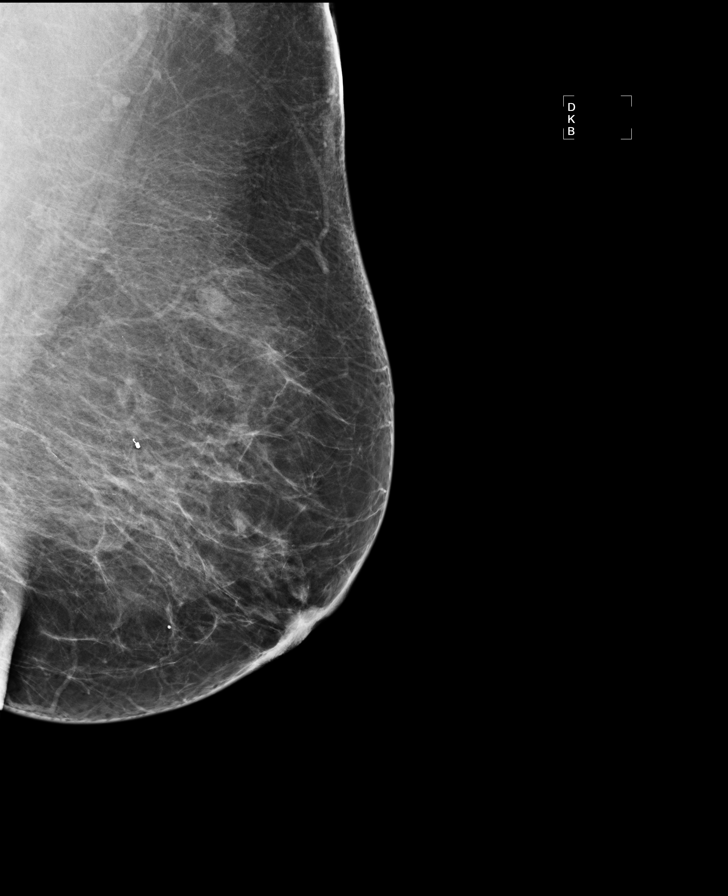

[R MLO (1 of 2)]
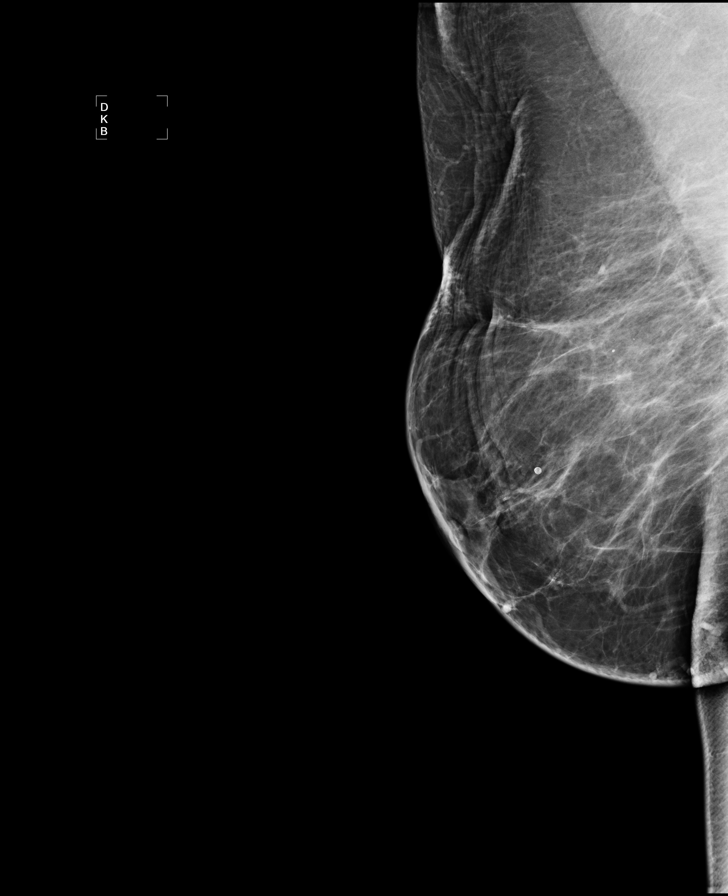

[R MLO (2 of 2)]
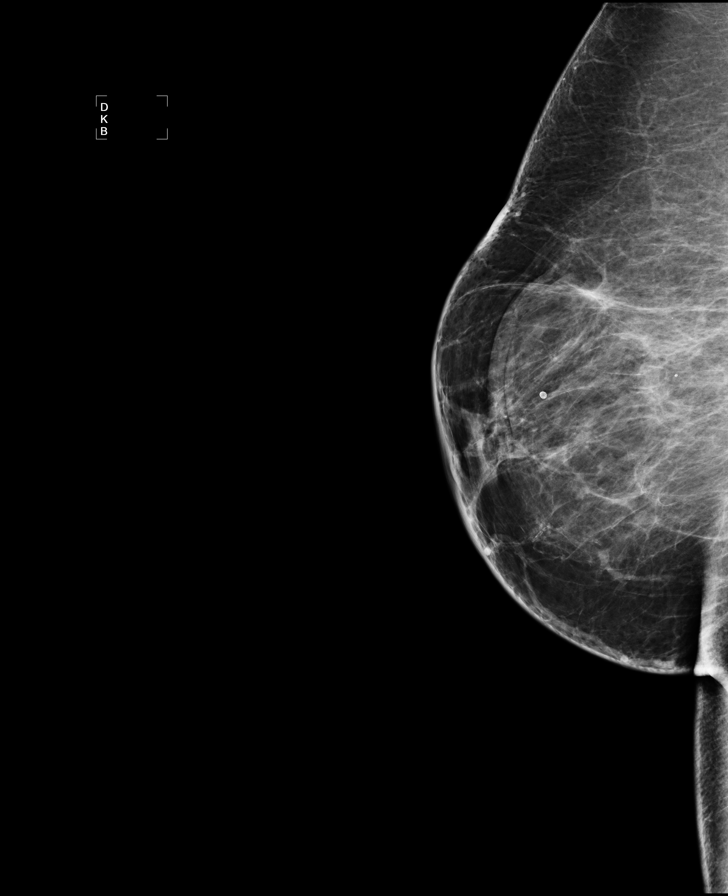

[L MLO (2 of 2)]
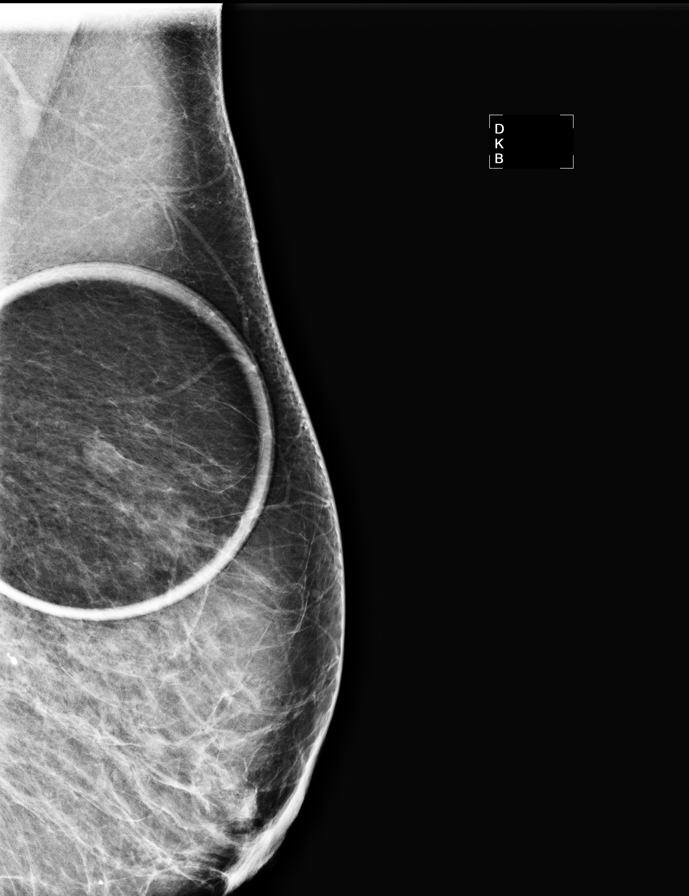

[L CC (2 of 2)]
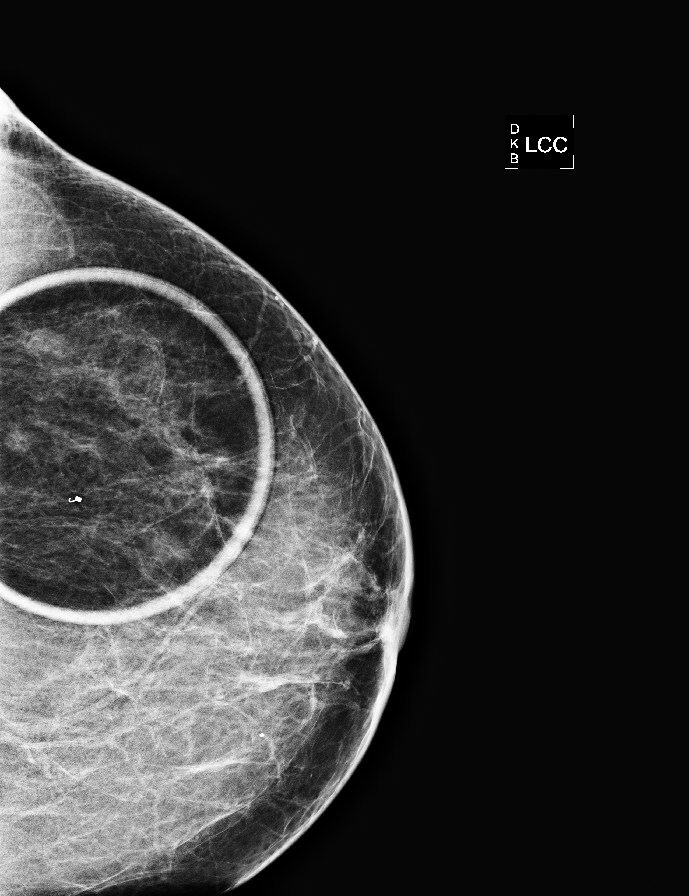

[7 of 7 positions shown; findings below may reference images not displayed]

FINDINGS: ACR Breast Density Category b:  There are scattered areas of
fibroglandular density.

Standard CC and MLO views of both breasts were obtained in addition
to spot compression CC and MLO views of the left breast.  The
previously biopsied mass in the outer left breast has decreased in
size since the previous examination.  There is a mass identified in
the upper outer left breast with indistinct margins, measuring up
to 1.3 cm.

Mammographic images were processed with CAD.

On physical exam, no palpable abnormalities identified.

Ultrasound is performed, showing a partially hypoechoic and
anechoic mass at 2 o'clock, 8 cm from the nipple corresponding with
the mammographic abnormality.  This mass contains partially
microlobulated margins and internal septations. It measures 1.1 x
0.5 x 0.8 cm.
IMPRESSION: 1. Complex cystic mass in the left breast, for which biopsy is
recommended.

2. Decreased sized of previously biopsied left breast mass.

BI-RADS CATEGORY 4:  Suspicious abnormality - biopsy should be
considered.

RECOMMENDATION:
Ultrasound guided biopsy of left breast mass.

I have discussed the findings and recommendations with the patient.
Results were also provided in writing at the conclusion of the
visit.  If applicable, a reminder letter will be sent to the
patient regarding the next appointment.

## 2014-06-21 ENCOUNTER — Other Ambulatory Visit: Payer: Self-pay

## 2014-06-21 DIAGNOSIS — Z1231 Encounter for screening mammogram for malignant neoplasm of breast: Secondary | ICD-10-CM

## 2014-07-27 ENCOUNTER — Ambulatory Visit: Payer: PRIVATE HEALTH INSURANCE

## 2014-07-30 ENCOUNTER — Ambulatory Visit
Admission: RE | Admit: 2014-07-30 | Discharge: 2014-07-30 | Disposition: A | Discharge status: Home / Self Care | Payer: PRIVATE HEALTH INSURANCE | Source: Ambulatory Visit

## 2014-07-30 ENCOUNTER — Other Ambulatory Visit: Payer: Self-pay

## 2014-07-30 ENCOUNTER — Encounter (INDEPENDENT_AMBULATORY_CARE_PROVIDER_SITE_OTHER): Payer: Self-pay

## 2014-07-30 DIAGNOSIS — Z1231 Encounter for screening mammogram for malignant neoplasm of breast: Secondary | ICD-10-CM

## 2015-06-23 ENCOUNTER — Other Ambulatory Visit: Payer: Self-pay

## 2015-06-23 ENCOUNTER — Other Ambulatory Visit (HOSPITAL_COMMUNITY): Payer: Self-pay | Admitting: Internal Medicine

## 2015-06-23 DIAGNOSIS — Z1231 Encounter for screening mammogram for malignant neoplasm of breast: Secondary | ICD-10-CM

## 2015-08-03 ENCOUNTER — Ambulatory Visit (HOSPITAL_COMMUNITY)
Admission: RE | Admit: 2015-08-03 | Discharge: 2015-08-03 | Disposition: A | Discharge status: Home / Self Care | Payer: PRIVATE HEALTH INSURANCE | Source: Ambulatory Visit | Attending: Internal Medicine | Admitting: Internal Medicine

## 2015-08-03 DIAGNOSIS — Z1231 Encounter for screening mammogram for malignant neoplasm of breast: Secondary | ICD-10-CM | POA: Insufficient documentation

## 2015-11-01 ENCOUNTER — Other Ambulatory Visit: Payer: Self-pay | Admitting: Surgery

## 2015-11-01 DIAGNOSIS — M5136 Other intervertebral disc degeneration, lumbar region: Secondary | ICD-10-CM

## 2015-11-23 ENCOUNTER — Ambulatory Visit
Admission: RE | Admit: 2015-11-23 | Discharge: 2015-11-23 | Disposition: A | Discharge status: Home / Self Care | Payer: PRIVATE HEALTH INSURANCE | Source: Ambulatory Visit | Attending: Surgery | Admitting: Surgery

## 2015-11-23 DIAGNOSIS — M5136 Other intervertebral disc degeneration, lumbar region: Secondary | ICD-10-CM | POA: Insufficient documentation

## 2016-06-28 ENCOUNTER — Other Ambulatory Visit: Payer: Self-pay | Admitting: Internal Medicine

## 2016-06-28 ENCOUNTER — Encounter: Payer: Self-pay | Admitting: Internal Medicine

## 2016-06-28 DIAGNOSIS — Z1239 Encounter for other screening for malignant neoplasm of breast: Secondary | ICD-10-CM

## 2016-07-03 ENCOUNTER — Other Ambulatory Visit (HOSPITAL_COMMUNITY): Payer: Self-pay | Admitting: Internal Medicine

## 2016-07-03 DIAGNOSIS — Z1231 Encounter for screening mammogram for malignant neoplasm of breast: Secondary | ICD-10-CM

## 2016-07-31 ENCOUNTER — Ambulatory Visit: Payer: PRIVATE HEALTH INSURANCE | Admitting: Gastroenterology

## 2016-08-13 ENCOUNTER — Ambulatory Visit (HOSPITAL_COMMUNITY)
Admission: RE | Admit: 2016-08-13 | Discharge: 2016-08-13 | Disposition: A | Discharge status: Home / Self Care | Payer: PRIVATE HEALTH INSURANCE | Source: Ambulatory Visit | Attending: Internal Medicine | Admitting: Internal Medicine

## 2016-08-13 DIAGNOSIS — Z1231 Encounter for screening mammogram for malignant neoplasm of breast: Secondary | ICD-10-CM | POA: Insufficient documentation

## 2016-08-14 ENCOUNTER — Ambulatory Visit (INDEPENDENT_AMBULATORY_CARE_PROVIDER_SITE_OTHER): Payer: PRIVATE HEALTH INSURANCE | Admitting: Gastroenterology

## 2016-08-14 ENCOUNTER — Encounter: Payer: Self-pay | Admitting: Gastroenterology

## 2016-08-14 ENCOUNTER — Other Ambulatory Visit: Payer: Self-pay

## 2016-08-14 VITALS — BP 123/60 | HR 82 | Temp 98.3°F | Ht 63.0 in | Wt 151.4 lb

## 2016-08-14 DIAGNOSIS — Z86010 Personal history of colonic polyps: Secondary | ICD-10-CM

## 2016-08-14 DIAGNOSIS — R110 Nausea: Secondary | ICD-10-CM | POA: Insufficient documentation

## 2016-08-14 MED ORDER — ONDANSETRON 4 MG PO TBDP: 4.0000 mg | ORAL_TABLET | Freq: Four times a day (QID) | ORAL | 0 refills | Status: DC | PRN

## 2016-08-14 NOTE — Progress Notes (Signed)
cc'ed to pcp °

## 2016-08-14 NOTE — Assessment & Plan Note (Signed)
History of colonic polyps in 2005, path available. Last colonoscopy in 2012 here with no polyps, only diverticulosis. Presents for surveillance colonoscopy.  I have discussed the risks, alternatives, benefits with regards to but not limited to the risk of reaction to medication, bleeding, infection, perforation and the patient is agreeable to proceed. Written consent to be obtained.  She requests having antiemetics on hand in case she needs around the time of bowel prep. Previously had lots of nausea related to the prep.

## 2016-08-14 NOTE — Progress Notes (Signed)
Primary Care Physician:  Pcp Not In System    Primary Gastroenterologist:  Garfield Cornea, MD   Chief Complaint  Patient presents with  . Colonoscopy    HPI:  Brenda Landry is a 75 y.o. female here to schedule surveillance colonoscopy. She has a history of multiple colonic polyps, removed from her colon in 2005 at [facility]. Pathology unknown. Her last colonoscopy was in May 2012. She had left-sided diverticulum but no polyp found at that time. Recommended have a repeat exam in May 2017.  Overall patient has been doing well. Bowel movements are regular. No blood in the stool or melena. She denies any abdominal pain. For 2-3 weeks she's had some nausea but no vomiting. Symptoms are intermittent. Goes away if she eats crackers. Some belching. No heartburn. No dysphagia. Weight is stable. Takes Aleve occasionally for arthritis but not daily. Just completed antibiotics for urinary tract infection last week, feels like her nausea has improved.   Current Outpatient Prescriptions  Medication Sig Dispense Refill  . alendronate (FOSAMAX) 70 MG tablet Take 70 mg by mouth every 7 (seven) days. Take with a full glass of water on an empty stomach.     Marland Kitchen aspirin 325 MG tablet Take 325 mg by mouth daily.      Marland Kitchen atorvastatin (LIPITOR) 40 MG tablet Take 40 mg by mouth daily.      . enalapril (VASOTEC) 5 MG tablet Take 5 mg by mouth daily.      . metFORMIN (GLUCOPHAGE) 500 MG tablet Take 500 mg by mouth 2 (two) times daily with a meal.      . triamterene-hydrochlorothiazide (DYAZIDE) 37.5-25 MG capsule Take 1 capsule by mouth daily.    .        No current facility-administered medications for this visit.     Allergies as of 08/14/2016 - Review Complete 08/14/2016  Allergen Reaction Noted  . Codeine Itching 08/14/2016    Past Medical History:  Diagnosis Date  . Breast cancer ([place]) 2007   lumpectomy, XRT, Arimadex  . Colon polyps 2005  . DM (diabetes mellitus) ([place])   . HTN  (hypertension)   . Hyperlipidemia   . Osteoarthritis   . Osteopenia   . Vitamin D deficiency     Past Surgical History:  Procedure Laterality Date  . APPENDECTOMY    . BREAST LUMPECTOMY  2007  . VAGINAL HYSTERECTOMY  2011   partial    Family History  Problem Relation Age of Onset  . Diabetes Mother   . Stroke Mother   . Heart disease Father   . Prostate cancer Brother   . Ovarian cancer Sister     female  . Colon cancer Neg Hx   . Liver disease Neg Hx     Social History   Social History  . Marital status: Widowed    Spouse name: N/A  . Number of children: 4  . Years of education: N/A   Occupational History  . Writer    Social History Main Topics  . Smoking status: Never Smoker  . Smokeless tobacco: Not on file  . Alcohol use No  . Drug use: No  . Sexual activity: Not on file   Other Topics Concern  . Not on file   Social History Narrative  . No narrative on file      ROS:  General: Negative for anorexia, weight loss, fever, chills, fatigue, weakness. Eyes: Negative for vision changes.  ENT: Negative for  hoarseness, difficulty swallowing , nasal congestion. CV: Negative for chest pain, angina, palpitations, dyspnea on exertion, peripheral edema.  Respiratory: Negative for dyspnea at rest, dyspnea on exertion, cough, sputum, wheezing.  GI: See history of present illness. GU:  Negative for dysuria, hematuria, urinary incontinence, urinary frequency, nocturnal urination.  MS: Negative for joint pain, low back pain.  Derm: Negative for rash or itching.  Neuro: Negative for weakness, abnormal sensation, seizure, frequent headaches, memory loss, confusion.  Psych: Negative for anxiety, depression, suicidal ideation, hallucinations.  Endo: Negative for unusual weight change.  Heme: Negative for bruising or bleeding. Allergy: Negative for rash or hives.    Physical Examination:  BP 123/60   Pulse 82   Temp 98.3 F (36.8 C) (Oral)   Ht 5'  3" (1.6 m)   Wt 151 lb 6.4 oz (68.7 kg)   BMI 26.82 kg/m    General: Well-nourished, well-developed in no acute distress.  Head: Normocephalic, atraumatic.   Eyes: Conjunctiva pink, no icterus. Mouth: Oropharyngeal mucosa moist and pink , no lesions erythema or exudate. Neck: Supple without thyromegaly, masses, or lymphadenopathy.  Lungs: Clear to auscultation bilaterally.  Heart: Regular rate and rhythm, no murmurs rubs or gallops.  Abdomen: Bowel sounds are normal, nontender, nondistended, no hepatosplenomegaly or masses, no abdominal bruits or    hernia , no rebound or guarding.   Rectal: Deferred Extremities: No lower extremity edema. No clubbing or deformities.  Neuro: Alert and oriented x 4 , grossly normal neurologically.  Skin: Warm and dry, no rash or jaundice.   Psych: Alert and cooperative, normal mood and affect.  Labs: Labs from April 2017 BUN 20, creatinine 0.7, total bilirubin 0.5, alkaline phosphatase 63, AST 12, ALT 17, albumin 4.5, hemoglobin 14.3, hematocrit 42.2, white blood cell count 8200, MCV 92.1, platelets 266,000.  Imaging Studies: Mm Screening Breast Tomo Bilateral  Result Date: 08/14/2016 CLINICAL DATA:  Screening. EXAM: 2D DIGITAL SCREENING BILATERAL MAMMOGRAM WITH CAD AND ADJUNCT TOMO COMPARISON:  Previous exam(s). ACR Breast Density Category b: There are scattered areas of fibroglandular density. FINDINGS: There are no findings suspicious for malignancy. Images were processed with CAD. IMPRESSION: No mammographic evidence of malignancy. A result letter of this screening mammogram will be mailed directly to the patient. RECOMMENDATION: Screening mammogram in one year. (Code:SM-B-01Y) BI-RADS CATEGORY  1: Negative. Electronically Signed   By: Altamese Cabal M.D.   On: 08/14/2016 07:58

## 2016-08-14 NOTE — Assessment & Plan Note (Signed)
Recent nausea, indigestion in the setting of a UTI. Patient reports symptoms have improved since taking antibiotics. Continue to monitor for now. She will let me know if ongoing symptoms.

## 2016-08-14 NOTE — Patient Instructions (Signed)
1. Colonoscopy as scheduled. Please see separate instructions. 2. Prescription for Zofran provided to help prevent nausea during bowel prep. You may take 1 tablet every 6 hours as needed. 3. Please let me know if her nausea does not settle down over the next week or so. May need further evaluation. I suspect it will continue to improve, likely due to recent urinary tract infection.

## 2016-08-29 ENCOUNTER — Ambulatory Visit (HOSPITAL_COMMUNITY)
Admission: RE | Admit: 2016-08-29 | Discharge: 2016-08-29 | Disposition: A | Discharge status: Home / Self Care | Payer: PRIVATE HEALTH INSURANCE | Source: Ambulatory Visit | Attending: Internal Medicine | Admitting: Internal Medicine

## 2016-08-29 ENCOUNTER — Encounter (HOSPITAL_COMMUNITY): Payer: Self-pay | Admitting: *Deleted

## 2016-08-29 ENCOUNTER — Encounter (HOSPITAL_COMMUNITY)
Admission: RE | Disposition: A | Discharge status: Home / Self Care | Payer: Self-pay | Source: Ambulatory Visit | Attending: Internal Medicine

## 2016-08-29 DIAGNOSIS — E785 Hyperlipidemia, unspecified: Secondary | ICD-10-CM | POA: Insufficient documentation

## 2016-08-29 DIAGNOSIS — Z1211 Encounter for screening for malignant neoplasm of colon: Secondary | ICD-10-CM | POA: Diagnosis not present

## 2016-08-29 DIAGNOSIS — Z86010 Personal history of colonic polyps: Secondary | ICD-10-CM | POA: Diagnosis not present

## 2016-08-29 DIAGNOSIS — Z79899 Other long term (current) drug therapy: Secondary | ICD-10-CM | POA: Insufficient documentation

## 2016-08-29 DIAGNOSIS — K5730 Diverticulosis of large intestine without perforation or abscess without bleeding: Secondary | ICD-10-CM | POA: Insufficient documentation

## 2016-08-29 DIAGNOSIS — Z7982 Long term (current) use of aspirin: Secondary | ICD-10-CM | POA: Insufficient documentation

## 2016-08-29 DIAGNOSIS — I10 Essential (primary) hypertension: Secondary | ICD-10-CM | POA: Insufficient documentation

## 2016-08-29 DIAGNOSIS — Z853 Personal history of malignant neoplasm of breast: Secondary | ICD-10-CM | POA: Diagnosis not present

## 2016-08-29 DIAGNOSIS — E119 Type 2 diabetes mellitus without complications: Secondary | ICD-10-CM | POA: Insufficient documentation

## 2016-08-29 DIAGNOSIS — M1990 Unspecified osteoarthritis, unspecified site: Secondary | ICD-10-CM | POA: Diagnosis not present

## 2016-08-29 DIAGNOSIS — Z7984 Long term (current) use of oral hypoglycemic drugs: Secondary | ICD-10-CM | POA: Diagnosis not present

## 2016-08-29 HISTORY — PX: COLONOSCOPY: SHX5424

## 2016-08-29 LAB — GLUCOSE, CAPILLARY: Glucose-Capillary: 87 mg/dL (ref 65–99)

## 2016-08-29 SURGERY — COLONOSCOPY
Anesthesia: Moderate Sedation

## 2016-08-29 MED ORDER — STERILE WATER FOR IRRIGATION IR SOLN
Status: DC | PRN
  Administered 2016-08-29: 2.5 mL

## 2016-08-29 MED ORDER — MIDAZOLAM HCL 5 MG/5ML IJ SOLN
INTRAMUSCULAR | Status: AC
  Filled 2016-08-29: qty 10

## 2016-08-29 MED ORDER — SODIUM CHLORIDE 0.9 % IV SOLN
INTRAVENOUS | Status: DC
  Administered 2016-08-29: 11:00:00 via INTRAVENOUS

## 2016-08-29 MED ORDER — ONDANSETRON HCL 4 MG/2ML IJ SOLN
INTRAMUSCULAR | Status: DC | PRN
  Administered 2016-08-29: 4 mg via INTRAVENOUS

## 2016-08-29 MED ORDER — ONDANSETRON HCL 4 MG/2ML IJ SOLN
INTRAMUSCULAR | Status: AC
  Filled 2016-08-29: qty 2

## 2016-08-29 MED ORDER — MEPERIDINE HCL 100 MG/ML IJ SOLN
INTRAMUSCULAR | Status: DC | PRN
  Administered 2016-08-29: 50 mg via INTRAVENOUS

## 2016-08-29 MED ORDER — MIDAZOLAM HCL 5 MG/5ML IJ SOLN
INTRAMUSCULAR | Status: DC | PRN
  Administered 2016-08-29: 1 mg via INTRAVENOUS
  Administered 2016-08-29: 2 mg via INTRAVENOUS

## 2016-08-29 MED ORDER — MEPERIDINE HCL 100 MG/ML IJ SOLN
INTRAMUSCULAR | Status: DC
Start: 2016-08-29 — End: 2016-08-29
  Filled 2016-08-29: qty 2

## 2016-08-29 NOTE — Interval H&P Note (Signed)
History and Physical Interval Note:  08/29/2016 11:22 AM  Brenda Landry  has presented today for surgery, with the diagnosis of history of colon polyps  The various methods of treatment have been discussed with the patient and family. After consideration of risks, benefits and other options for treatment, the patient has consented to  Procedure(s) with comments: COLONOSCOPY (N/A) - 12:00 pm as a surgical intervention .  The patient's history has been reviewed, patient examined, no change in status, stable for surgery.  I have reviewed the patient's chart and labs.  Questions were answered to the patient's satisfaction.    No change; surveillance TCS per plan. The risks, benefits, limitations, alternatives and imponderables have been reviewed with the patient. Questions have been answered. All parties are agreeable.  Manus Rudd

## 2016-08-29 NOTE — H&P (View-Only) (Signed)
Primary Care Physician:  Pcp Not In System    Primary Gastroenterologist:  Garfield Cornea, MD   Chief Complaint  Patient presents with  . Colonoscopy    HPI:  Brenda Landry is a 75 y.o. female here to schedule surveillance colonoscopy. She has a history of multiple colonic polyps, removed from her colon in 2005 at [facility]. Pathology unknown. Her last colonoscopy was in May 2012. She had left-sided diverticulum but no polyp found at that time. Recommended have a repeat exam in May 2017.  Overall patient has been doing well. Bowel movements are regular. No blood in the stool or melena. She denies any abdominal pain. For 2-3 weeks she's had some nausea but no vomiting. Symptoms are intermittent. Goes away if she eats crackers. Some belching. No heartburn. No dysphagia. Weight is stable. Takes Aleve occasionally for arthritis but not daily. Just completed antibiotics for urinary tract infection last week, feels like her nausea has improved.   Current Outpatient Prescriptions  Medication Sig Dispense Refill  . alendronate (FOSAMAX) 70 MG tablet Take 70 mg by mouth every 7 (seven) days. Take with a full glass of water on an empty stomach.     Marland Kitchen aspirin 325 MG tablet Take 325 mg by mouth daily.      Marland Kitchen atorvastatin (LIPITOR) 40 MG tablet Take 40 mg by mouth daily.      . enalapril (VASOTEC) 5 MG tablet Take 5 mg by mouth daily.      . metFORMIN (GLUCOPHAGE) 500 MG tablet Take 500 mg by mouth 2 (two) times daily with a meal.      . triamterene-hydrochlorothiazide (DYAZIDE) 37.5-25 MG capsule Take 1 capsule by mouth daily.    .        No current facility-administered medications for this visit.     Allergies as of 08/14/2016 - Review Complete 08/14/2016  Allergen Reaction Noted  . Codeine Itching 08/14/2016    Past Medical History:  Diagnosis Date  . Breast cancer ([place]) 2007   lumpectomy, XRT, Arimadex  . Colon polyps 2005  . DM (diabetes mellitus) ([place])   . HTN  (hypertension)   . Hyperlipidemia   . Osteoarthritis   . Osteopenia   . Vitamin D deficiency     Past Surgical History:  Procedure Laterality Date  . APPENDECTOMY    . BREAST LUMPECTOMY  2007  . VAGINAL HYSTERECTOMY  2011   partial    Family History  Problem Relation Age of Onset  . Diabetes Mother   . Stroke Mother   . Heart disease Father   . Prostate cancer Brother   . Ovarian cancer Sister     female  . Colon cancer Neg Hx   . Liver disease Neg Hx     Social History   Social History  . Marital status: Widowed    Spouse name: N/A  . Number of children: 4  . Years of education: N/A   Occupational History  . Writer    Social History Main Topics  . Smoking status: Never Smoker  . Smokeless tobacco: Not on file  . Alcohol use No  . Drug use: No  . Sexual activity: Not on file   Other Topics Concern  . Not on file   Social History Narrative  . No narrative on file      ROS:  General: Negative for anorexia, weight loss, fever, chills, fatigue, weakness. Eyes: Negative for vision changes.  ENT: Negative for  hoarseness, difficulty swallowing , nasal congestion. CV: Negative for chest pain, angina, palpitations, dyspnea on exertion, peripheral edema.  Respiratory: Negative for dyspnea at rest, dyspnea on exertion, cough, sputum, wheezing.  GI: See history of present illness. GU:  Negative for dysuria, hematuria, urinary incontinence, urinary frequency, nocturnal urination.  MS: Negative for joint pain, low back pain.  Derm: Negative for rash or itching.  Neuro: Negative for weakness, abnormal sensation, seizure, frequent headaches, memory loss, confusion.  Psych: Negative for anxiety, depression, suicidal ideation, hallucinations.  Endo: Negative for unusual weight change.  Heme: Negative for bruising or bleeding. Allergy: Negative for rash or hives.    Physical Examination:  BP 123/60   Pulse 82   Temp 98.3 F (36.8 C) (Oral)   Ht 5'  3" (1.6 m)   Wt 151 lb 6.4 oz (68.7 kg)   BMI 26.82 kg/m    General: Well-nourished, well-developed in no acute distress.  Head: Normocephalic, atraumatic.   Eyes: Conjunctiva pink, no icterus. Mouth: Oropharyngeal mucosa moist and pink , no lesions erythema or exudate. Neck: Supple without thyromegaly, masses, or lymphadenopathy.  Lungs: Clear to auscultation bilaterally.  Heart: Regular rate and rhythm, no murmurs rubs or gallops.  Abdomen: Bowel sounds are normal, nontender, nondistended, no hepatosplenomegaly or masses, no abdominal bruits or    hernia , no rebound or guarding.   Rectal: Deferred Extremities: No lower extremity edema. No clubbing or deformities.  Neuro: Alert and oriented x 4 , grossly normal neurologically.  Skin: Warm and dry, no rash or jaundice.   Psych: Alert and cooperative, normal mood and affect.  Labs: Labs from April 2017 BUN 20, creatinine 0.7, total bilirubin 0.5, alkaline phosphatase 63, AST 12, ALT 17, albumin 4.5, hemoglobin 14.3, hematocrit 42.2, white blood cell count 8200, MCV 92.1, platelets 266,000.  Imaging Studies: Mm Screening Breast Tomo Bilateral  Result Date: 08/14/2016 CLINICAL DATA:  Screening. EXAM: 2D DIGITAL SCREENING BILATERAL MAMMOGRAM WITH CAD AND ADJUNCT TOMO COMPARISON:  Previous exam(s). ACR Breast Density Category b: There are scattered areas of fibroglandular density. FINDINGS: There are no findings suspicious for malignancy. Images were processed with CAD. IMPRESSION: No mammographic evidence of malignancy. A result letter of this screening mammogram will be mailed directly to the patient. RECOMMENDATION: Screening mammogram in one year. (Code:SM-B-01Y) BI-RADS CATEGORY  1: Negative. Electronically Signed   By: Altamese Cabal M.D.   On: 08/14/2016 07:58

## 2016-08-29 NOTE — Discharge Instructions (Signed)
Colonoscopy Discharge Instructions  Read the instructions outlined below and refer to this sheet in the next few weeks. These discharge instructions provide you with general information on caring for yourself after you leave the hospital. Your doctor may also give you specific instructions. While your treatment has been planned according to the most current medical practices available, unavoidable complications occasionally occur. If you have any problems or questions after discharge, call Dr. Gala Romney at [phone]. ACTIVITY  You may resume your regular activity, but move at a slower pace for the next 24 hours.   Take frequent rest periods for the next 24 hours.   Walking will help get rid of the air and reduce the bloated feeling in your belly (abdomen).   No driving for 24 hours (because of the medicine (anesthesia) used during the test).    Do not sign any important legal documents or operate any machinery for 24 hours (because of the anesthesia used during the test).  NUTRITION  Drink plenty of fluids.   You may resume your normal diet as instructed by your doctor.   Begin with a light meal and progress to your normal diet. Heavy or fried foods are harder to digest and may make you feel sick to your stomach (nauseated).   Avoid alcoholic beverages for 24 hours or as instructed.  MEDICATIONS  You may resume your normal medications unless your doctor tells you otherwise.  WHAT YOU CAN EXPECT TODAY  Some feelings of bloating in the abdomen.   Passage of more gas than usual.   Spotting of blood in your stool or on the toilet paper.  IF YOU HAD POLYPS REMOVED DURING THE COLONOSCOPY:  No aspirin products for 7 days or as instructed.   No alcohol for 7 days or as instructed.   Eat a soft diet for the next 24 hours.  FINDING OUT THE RESULTS OF YOUR TEST Not all test results are available during your visit. If your test results are not back during the visit, make an appointment  with your caregiver to find out the results. Do not assume everything is normal if you have not heard from your caregiver or the medical facility. It is important for you to follow up on all of your test results.  SEEK IMMEDIATE MEDICAL ATTENTION IF:  You have more than a spotting of blood in your stool.   Your belly is swollen (abdominal distention).   You are nauseated or vomiting.   You have a temperature over 101.   You have abdominal pain or discomfort that is severe or gets worse throughout the day.   Diverticulosis information provided  No future colonoscopy unless new symptoms develop   Diverticulosis Diverticulosis is the condition that develops when small pouches (diverticula) form in the wall of your colon. Your colon, or large intestine, is where water is absorbed and stool is formed. The pouches form when the inside layer of your colon pushes through weak spots in the outer layers of your colon. CAUSES  No one knows exactly what causes diverticulosis. RISK FACTORS  Being older than 55. Your risk for this condition increases with age. Diverticulosis is rare in people younger than 40 years. By age 72, almost everyone has it.  Eating a low-fiber diet.  Being frequently constipated.  Being overweight.  Not getting enough exercise.  Smoking.  Taking over-the-counter pain medicines, like aspirin and ibuprofen. SYMPTOMS  Most people with diverticulosis do not have symptoms. DIAGNOSIS  Because diverticulosis often has no  symptoms, health care providers often discover the condition during an exam for other colon problems. In many cases, a health care provider will diagnose diverticulosis while using a flexible scope to examine the colon (colonoscopy). TREATMENT  If you have never developed an infection related to diverticulosis, you may not need treatment. If you have had an infection before, treatment may include:  Eating more fruits, vegetables, and grains.  Taking  a fiber supplement.  Taking a live bacteria supplement (probiotic).  Taking medicine to relax your colon. HOME CARE INSTRUCTIONS   Drink at least 6-8 glasses of water each day to prevent constipation.  Try not to strain when you have a bowel movement.  Keep all follow-up appointments. If you have had an infection before:  Increase the fiber in your diet as directed by your health care provider or dietitian.  Take a dietary fiber supplement if your health care provider approves.  Only take medicines as directed by your health care provider. SEEK MEDICAL CARE IF:   You have abdominal pain.  You have bloating.  You have cramps.  You have not gone to the bathroom in 3 days. SEEK IMMEDIATE MEDICAL CARE IF:   Your pain gets worse.  Yourbloating becomes very bad.  You have a fever or chills, and your symptoms suddenly get worse.  You begin vomiting.  You have bowel movements that are bloody or black. MAKE SURE YOU:  Understand these instructions.  Will watch your condition.  Will get help right away if you are not doing well or get worse.   This information is not intended to replace advice given to you by your health care provider. Make sure you discuss any questions you have with your health care provider.   Document Released: 08/16/2004 Document Revised: 11/24/2013 Document Reviewed: 10/14/2013 Elsevier Interactive Patient Education [employer].

## 2016-08-29 NOTE — Op Note (Signed)
[facility] Patient Name: Brenda Landry Procedure Date: 08/29/2016 11:02 AM MRN: [id number] Date of Birth: [birth year] Attending MD: Norvel Richards , MD CSN: [id number] Age: 75 Admit Type: Outpatient Procedure:                Colonoscopy Indications:              High risk colon cancer surveillance: Personal                            history of colonic polyps Providers:                Norvel Richards, MD, Lurline Del, RN, Randa Spike, Technician Referring MD:              Medicines:                Midazolam 3 mg IV, Meperidine 50 mg IV, Ondansetron                            4 mg IV Complications:            No immediate complications. Estimated blood loss:                            None. Estimated Blood Loss:     Estimated blood loss: none. Procedure:                Pre-Anesthesia Assessment:                           - Prior to the procedure, a History and Physical                            was performed, and patient medications and                            allergies were reviewed. The patient's tolerance of                            previous anesthesia was also reviewed. The risks                            and benefits of the procedure and the sedation                            options and risks were discussed with the patient.                            All questions were answered, and informed consent                            was obtained. Prior Anticoagulants: The patient has                            taken  no previous anticoagulant or antiplatelet                            agents. ASA Grade Assessment: II - A patient with                            mild systemic disease. After reviewing the risks                            and benefits, the patient was deemed in                            satisfactory condition to undergo the procedure.                           After obtaining informed consent, the colonoscope                  was passed under direct vision. Throughout the                            procedure, the patient's blood pressure, pulse, and                            oxygen saturations were monitored continuously. The                            [id number] scope was introduced through                            the anus and advanced to the the cecum, identified                            by appendiceal orifice and ileocecal valve. The                            colonoscopy was performed without difficulty. The                            patient tolerated the procedure well. The quality                            of the bowel preparation was adequate. The                            ileocecal valve, appendiceal orifice, and rectum                            were photographed. Scope In: 11:30:07 AM Scope Out: 11:45:11 AM Scope Withdrawal Time: 0 hours 10 minutes 0 seconds  Total Procedure Duration: 0 hours 15 minutes 4 seconds  Findings:      The perianal and digital rectal examinations were normal.      Scattered small and large-mouthed diverticula were found in the sigmoid       colon and descending colon.  The exam was otherwise without abnormality on direct and retroflexion       views. Impression:               - Diverticulosis in the sigmoid colon and in the                            descending colon.                           - The examination was otherwise normal on direct                            and retroflexion views.                           - No specimens collected. Moderate Sedation:      Moderate (conscious) sedation was administered by the endoscopy nurse       and supervised by the endoscopist. The following parameters were       monitored: oxygen saturation, heart rate, blood pressure, respiratory       rate, EKG, adequacy of pulmonary ventilation, and response to care.       Total physician intraservice time was 21 minutes. Recommendation:           -  Return to GI office PRN.                           - Advance diet as tolerated.                           - Repeat colonoscopy is not recommended.                           - Continue present medications. Procedure Code(s):        --- Professional ---                           [phone], Colonoscopy, flexible; diagnostic, including                            collection of specimen(s) by brushing or washing,                            when performed (separate procedure)                           99152, Moderate sedation services provided by the                            same physician or other qualified health care                            professional performing the diagnostic or                            therapeutic service that the sedation supports,  requiring the presence of an independent trained                            observer to assist in the monitoring of the                            patient's level of consciousness and physiological                            status; initial 15 minutes of intraservice time,                            patient age 31 years or older Diagnosis Code(s):        --- Professional ---                           Z86.010, Personal history of colonic polyps                           K57.30, Diverticulosis of large intestine without                            perforation or abscess without bleeding CPT copyright 2016 American [employer]. All rights reserved. The codes documented in this report are preliminary and upon coder review may  be revised to meet current compliance requirements. Cristopher Estimable. Zorion Nims, MD Norvel Richards, MD 08/29/2016 11:58:19 AM This report has been signed electronically. Number of Addenda: 0

## 2016-09-03 ENCOUNTER — Encounter (HOSPITAL_COMMUNITY): Payer: Self-pay | Admitting: Internal Medicine

## 2017-07-05 ENCOUNTER — Other Ambulatory Visit: Payer: Self-pay | Admitting: Internal Medicine

## 2017-07-05 DIAGNOSIS — Z1231 Encounter for screening mammogram for malignant neoplasm of breast: Secondary | ICD-10-CM

## 2017-08-15 ENCOUNTER — Ambulatory Visit (HOSPITAL_COMMUNITY): Payer: PRIVATE HEALTH INSURANCE

## 2017-08-16 ENCOUNTER — Ambulatory Visit (HOSPITAL_COMMUNITY): Payer: PRIVATE HEALTH INSURANCE

## 2017-08-22 ENCOUNTER — Ambulatory Visit (HOSPITAL_COMMUNITY)
Admission: RE | Admit: 2017-08-22 | Discharge: 2017-08-22 | Disposition: A | Discharge status: Home / Self Care | Payer: Medicare Other | Source: Ambulatory Visit | Attending: Internal Medicine | Admitting: Internal Medicine

## 2017-08-22 DIAGNOSIS — Z1231 Encounter for screening mammogram for malignant neoplasm of breast: Secondary | ICD-10-CM | POA: Diagnosis not present

## 2018-07-17 ENCOUNTER — Other Ambulatory Visit (HOSPITAL_COMMUNITY): Payer: Self-pay | Admitting: Internal Medicine

## 2018-07-17 DIAGNOSIS — Z1231 Encounter for screening mammogram for malignant neoplasm of breast: Secondary | ICD-10-CM

## 2018-08-25 ENCOUNTER — Ambulatory Visit (HOSPITAL_COMMUNITY): Payer: Medicare Other

## 2018-08-28 ENCOUNTER — Ambulatory Visit (HOSPITAL_COMMUNITY)
Admission: RE | Admit: 2018-08-28 | Discharge: 2018-08-28 | Disposition: A | Discharge status: Home / Self Care | Payer: Medicare Other | Source: Ambulatory Visit | Attending: Internal Medicine | Admitting: Internal Medicine

## 2018-08-28 ENCOUNTER — Encounter (HOSPITAL_COMMUNITY): Payer: Self-pay

## 2018-08-28 DIAGNOSIS — Z1231 Encounter for screening mammogram for malignant neoplasm of breast: Secondary | ICD-10-CM | POA: Diagnosis not present

## 2019-05-12 DIAGNOSIS — Z1239 Encounter for other screening for malignant neoplasm of breast: Secondary | ICD-10-CM | POA: Diagnosis not present

## 2019-05-12 DIAGNOSIS — Z79899 Other long term (current) drug therapy: Secondary | ICD-10-CM | POA: Diagnosis not present

## 2019-05-12 DIAGNOSIS — E119 Type 2 diabetes mellitus without complications: Secondary | ICD-10-CM | POA: Diagnosis not present

## 2019-05-12 DIAGNOSIS — E782 Mixed hyperlipidemia: Secondary | ICD-10-CM | POA: Diagnosis not present

## 2019-05-12 DIAGNOSIS — I10 Essential (primary) hypertension: Secondary | ICD-10-CM | POA: Diagnosis not present

## 2019-05-12 DIAGNOSIS — Z0000 Encounter for general adult medical examination without abnormal findings: Secondary | ICD-10-CM | POA: Diagnosis not present

## 2019-05-12 DIAGNOSIS — R252 Cramp and spasm: Secondary | ICD-10-CM | POA: Diagnosis not present

## 2019-06-30 ENCOUNTER — Other Ambulatory Visit (HOSPITAL_COMMUNITY): Payer: Self-pay | Admitting: Internal Medicine

## 2019-06-30 DIAGNOSIS — Z1231 Encounter for screening mammogram for malignant neoplasm of breast: Secondary | ICD-10-CM

## 2019-07-28 DIAGNOSIS — I10 Essential (primary) hypertension: Secondary | ICD-10-CM | POA: Diagnosis not present

## 2019-07-28 DIAGNOSIS — R42 Dizziness and giddiness: Secondary | ICD-10-CM | POA: Diagnosis not present

## 2019-09-04 ENCOUNTER — Other Ambulatory Visit: Payer: Self-pay

## 2019-09-04 ENCOUNTER — Ambulatory Visit (HOSPITAL_COMMUNITY)
Admission: RE | Admit: 2019-09-04 | Discharge: 2019-09-04 | Disposition: A | Discharge status: Home / Self Care | Payer: Medicare HMO | Source: Ambulatory Visit | Attending: Internal Medicine | Admitting: Internal Medicine

## 2019-09-04 DIAGNOSIS — Z1231 Encounter for screening mammogram for malignant neoplasm of breast: Secondary | ICD-10-CM | POA: Insufficient documentation

## 2019-09-15 DIAGNOSIS — E782 Mixed hyperlipidemia: Secondary | ICD-10-CM | POA: Diagnosis not present

## 2019-09-15 DIAGNOSIS — Z23 Encounter for immunization: Secondary | ICD-10-CM | POA: Diagnosis not present

## 2019-09-15 DIAGNOSIS — I10 Essential (primary) hypertension: Secondary | ICD-10-CM | POA: Diagnosis not present

## 2019-09-15 DIAGNOSIS — Z0000 Encounter for general adult medical examination without abnormal findings: Secondary | ICD-10-CM | POA: Diagnosis not present

## 2019-09-15 DIAGNOSIS — E119 Type 2 diabetes mellitus without complications: Secondary | ICD-10-CM | POA: Diagnosis not present

## 2019-09-15 DIAGNOSIS — Z79899 Other long term (current) drug therapy: Secondary | ICD-10-CM | POA: Diagnosis not present

## 2019-09-28 DIAGNOSIS — Z1211 Encounter for screening for malignant neoplasm of colon: Secondary | ICD-10-CM | POA: Diagnosis not present

## 2020-01-20 DIAGNOSIS — E119 Type 2 diabetes mellitus without complications: Secondary | ICD-10-CM | POA: Diagnosis not present

## 2020-01-20 DIAGNOSIS — Z0000 Encounter for general adult medical examination without abnormal findings: Secondary | ICD-10-CM | POA: Diagnosis not present

## 2020-01-20 DIAGNOSIS — E785 Hyperlipidemia, unspecified: Secondary | ICD-10-CM | POA: Diagnosis not present

## 2020-01-20 DIAGNOSIS — Z79899 Other long term (current) drug therapy: Secondary | ICD-10-CM | POA: Diagnosis not present

## 2020-01-20 DIAGNOSIS — I10 Essential (primary) hypertension: Secondary | ICD-10-CM | POA: Diagnosis not present

## 2020-05-19 DIAGNOSIS — E119 Type 2 diabetes mellitus without complications: Secondary | ICD-10-CM | POA: Diagnosis not present

## 2020-05-19 DIAGNOSIS — Z79899 Other long term (current) drug therapy: Secondary | ICD-10-CM | POA: Diagnosis not present

## 2020-05-19 DIAGNOSIS — I10 Essential (primary) hypertension: Secondary | ICD-10-CM | POA: Diagnosis not present

## 2020-05-19 DIAGNOSIS — E782 Mixed hyperlipidemia: Secondary | ICD-10-CM | POA: Diagnosis not present

## 2020-07-27 ENCOUNTER — Other Ambulatory Visit (HOSPITAL_COMMUNITY): Payer: Self-pay | Admitting: Internal Medicine

## 2020-07-27 DIAGNOSIS — Z1231 Encounter for screening mammogram for malignant neoplasm of breast: Secondary | ICD-10-CM

## 2020-09-05 ENCOUNTER — Other Ambulatory Visit: Payer: Self-pay

## 2020-09-05 ENCOUNTER — Ambulatory Visit (HOSPITAL_COMMUNITY)
Admission: RE | Admit: 2020-09-05 | Discharge: 2020-09-05 | Disposition: A | Discharge status: Home / Self Care | Payer: Medicare HMO | Source: Ambulatory Visit | Attending: Internal Medicine | Admitting: Internal Medicine

## 2020-09-05 DIAGNOSIS — Z1231 Encounter for screening mammogram for malignant neoplasm of breast: Secondary | ICD-10-CM

## 2020-09-19 DIAGNOSIS — R8290 Unspecified abnormal findings in urine: Secondary | ICD-10-CM | POA: Diagnosis not present

## 2020-09-19 DIAGNOSIS — I10 Essential (primary) hypertension: Secondary | ICD-10-CM | POA: Diagnosis not present

## 2020-09-19 DIAGNOSIS — E782 Mixed hyperlipidemia: Secondary | ICD-10-CM | POA: Diagnosis not present

## 2020-09-19 DIAGNOSIS — Z79899 Other long term (current) drug therapy: Secondary | ICD-10-CM | POA: Diagnosis not present

## 2020-09-19 DIAGNOSIS — E119 Type 2 diabetes mellitus without complications: Secondary | ICD-10-CM | POA: Diagnosis not present

## 2020-09-19 DIAGNOSIS — Z1211 Encounter for screening for malignant neoplasm of colon: Secondary | ICD-10-CM | POA: Diagnosis not present

## 2020-09-28 DIAGNOSIS — I10 Essential (primary) hypertension: Secondary | ICD-10-CM | POA: Diagnosis not present

## 2020-09-28 DIAGNOSIS — E782 Mixed hyperlipidemia: Secondary | ICD-10-CM | POA: Diagnosis not present

## 2020-09-28 DIAGNOSIS — Z79899 Other long term (current) drug therapy: Secondary | ICD-10-CM | POA: Diagnosis not present

## 2020-09-28 DIAGNOSIS — Z1211 Encounter for screening for malignant neoplasm of colon: Secondary | ICD-10-CM | POA: Diagnosis not present

## 2020-09-28 DIAGNOSIS — E119 Type 2 diabetes mellitus without complications: Secondary | ICD-10-CM | POA: Diagnosis not present

## 2021-01-03 DIAGNOSIS — E119 Type 2 diabetes mellitus without complications: Secondary | ICD-10-CM | POA: Diagnosis not present

## 2021-01-03 DIAGNOSIS — E785 Hyperlipidemia, unspecified: Secondary | ICD-10-CM | POA: Diagnosis not present

## 2021-01-03 DIAGNOSIS — Z79899 Other long term (current) drug therapy: Secondary | ICD-10-CM | POA: Diagnosis not present

## 2021-01-03 DIAGNOSIS — I10 Essential (primary) hypertension: Secondary | ICD-10-CM | POA: Diagnosis not present

## 2021-01-03 DIAGNOSIS — Z0000 Encounter for general adult medical examination without abnormal findings: Secondary | ICD-10-CM | POA: Diagnosis not present

## 2021-04-20 DIAGNOSIS — I10 Essential (primary) hypertension: Secondary | ICD-10-CM | POA: Diagnosis not present

## 2021-04-20 DIAGNOSIS — E782 Mixed hyperlipidemia: Secondary | ICD-10-CM | POA: Diagnosis not present

## 2021-04-20 DIAGNOSIS — E118 Type 2 diabetes mellitus with unspecified complications: Secondary | ICD-10-CM | POA: Diagnosis not present

## 2021-08-02 ENCOUNTER — Other Ambulatory Visit (HOSPITAL_COMMUNITY): Payer: Self-pay | Admitting: Internal Medicine

## 2021-08-02 DIAGNOSIS — Z1231 Encounter for screening mammogram for malignant neoplasm of breast: Secondary | ICD-10-CM

## 2021-08-08 DIAGNOSIS — E782 Mixed hyperlipidemia: Secondary | ICD-10-CM | POA: Diagnosis not present

## 2021-08-08 DIAGNOSIS — E118 Type 2 diabetes mellitus with unspecified complications: Secondary | ICD-10-CM | POA: Diagnosis not present

## 2021-08-08 DIAGNOSIS — Z79899 Other long term (current) drug therapy: Secondary | ICD-10-CM | POA: Diagnosis not present

## 2021-08-08 DIAGNOSIS — I10 Essential (primary) hypertension: Secondary | ICD-10-CM | POA: Diagnosis not present

## 2021-08-14 DIAGNOSIS — E119 Type 2 diabetes mellitus without complications: Secondary | ICD-10-CM | POA: Diagnosis not present

## 2021-08-14 DIAGNOSIS — Z79899 Other long term (current) drug therapy: Secondary | ICD-10-CM | POA: Diagnosis not present

## 2021-08-14 DIAGNOSIS — I10 Essential (primary) hypertension: Secondary | ICD-10-CM | POA: Diagnosis not present

## 2021-08-14 DIAGNOSIS — E7800 Pure hypercholesterolemia, unspecified: Secondary | ICD-10-CM | POA: Diagnosis not present

## 2021-09-08 ENCOUNTER — Ambulatory Visit (HOSPITAL_COMMUNITY)
Admission: RE | Admit: 2021-09-08 | Discharge: 2021-09-08 | Disposition: A | Discharge status: Home / Self Care | Payer: Medicare HMO | Source: Ambulatory Visit | Attending: Internal Medicine | Admitting: Internal Medicine

## 2021-09-08 ENCOUNTER — Other Ambulatory Visit: Payer: Self-pay

## 2021-09-08 DIAGNOSIS — Z1231 Encounter for screening mammogram for malignant neoplasm of breast: Secondary | ICD-10-CM | POA: Diagnosis not present

## 2021-12-08 DIAGNOSIS — Z79899 Other long term (current) drug therapy: Secondary | ICD-10-CM | POA: Diagnosis not present

## 2021-12-08 DIAGNOSIS — E118 Type 2 diabetes mellitus with unspecified complications: Secondary | ICD-10-CM | POA: Diagnosis not present

## 2021-12-08 DIAGNOSIS — I10 Essential (primary) hypertension: Secondary | ICD-10-CM | POA: Diagnosis not present

## 2021-12-14 DIAGNOSIS — E118 Type 2 diabetes mellitus with unspecified complications: Secondary | ICD-10-CM | POA: Diagnosis not present

## 2021-12-14 DIAGNOSIS — E782 Mixed hyperlipidemia: Secondary | ICD-10-CM | POA: Diagnosis not present

## 2021-12-14 DIAGNOSIS — I10 Essential (primary) hypertension: Secondary | ICD-10-CM | POA: Diagnosis not present

## 2021-12-14 DIAGNOSIS — Z79899 Other long term (current) drug therapy: Secondary | ICD-10-CM | POA: Diagnosis not present

## 2021-12-14 DIAGNOSIS — M170 Bilateral primary osteoarthritis of knee: Secondary | ICD-10-CM | POA: Diagnosis not present

## 2022-03-23 DIAGNOSIS — E118 Type 2 diabetes mellitus with unspecified complications: Secondary | ICD-10-CM | POA: Diagnosis not present

## 2022-03-23 DIAGNOSIS — Z79899 Other long term (current) drug therapy: Secondary | ICD-10-CM | POA: Diagnosis not present

## 2022-03-23 DIAGNOSIS — I10 Essential (primary) hypertension: Secondary | ICD-10-CM | POA: Diagnosis not present

## 2022-03-23 DIAGNOSIS — E782 Mixed hyperlipidemia: Secondary | ICD-10-CM | POA: Diagnosis not present

## 2022-03-23 DIAGNOSIS — E785 Hyperlipidemia, unspecified: Secondary | ICD-10-CM | POA: Diagnosis not present

## 2022-03-23 DIAGNOSIS — M25561 Pain in right knee: Secondary | ICD-10-CM | POA: Diagnosis not present

## 2022-03-23 DIAGNOSIS — E119 Type 2 diabetes mellitus without complications: Secondary | ICD-10-CM | POA: Diagnosis not present

## 2022-03-23 DIAGNOSIS — Z0000 Encounter for general adult medical examination without abnormal findings: Secondary | ICD-10-CM | POA: Diagnosis not present

## 2022-04-13 DIAGNOSIS — M1711 Unilateral primary osteoarthritis, right knee: Secondary | ICD-10-CM | POA: Diagnosis not present

## 2022-07-17 DIAGNOSIS — M1711 Unilateral primary osteoarthritis, right knee: Secondary | ICD-10-CM | POA: Diagnosis not present

## 2022-09-28 DIAGNOSIS — E782 Mixed hyperlipidemia: Secondary | ICD-10-CM | POA: Diagnosis not present

## 2022-09-28 DIAGNOSIS — E118 Type 2 diabetes mellitus with unspecified complications: Secondary | ICD-10-CM | POA: Diagnosis not present

## 2022-09-28 DIAGNOSIS — I10 Essential (primary) hypertension: Secondary | ICD-10-CM | POA: Diagnosis not present

## 2022-09-28 DIAGNOSIS — Z79899 Other long term (current) drug therapy: Secondary | ICD-10-CM | POA: Diagnosis not present

## 2022-09-28 DIAGNOSIS — R55 Syncope and collapse: Secondary | ICD-10-CM | POA: Diagnosis not present

## 2022-10-04 DIAGNOSIS — E119 Type 2 diabetes mellitus without complications: Secondary | ICD-10-CM | POA: Diagnosis not present

## 2022-10-04 DIAGNOSIS — E782 Mixed hyperlipidemia: Secondary | ICD-10-CM | POA: Diagnosis not present

## 2022-10-04 DIAGNOSIS — R55 Syncope and collapse: Secondary | ICD-10-CM | POA: Diagnosis not present

## 2022-10-04 DIAGNOSIS — I10 Essential (primary) hypertension: Secondary | ICD-10-CM | POA: Diagnosis not present

## 2022-10-04 DIAGNOSIS — R002 Palpitations: Secondary | ICD-10-CM | POA: Diagnosis not present

## 2022-10-30 DIAGNOSIS — M1711 Unilateral primary osteoarthritis, right knee: Secondary | ICD-10-CM | POA: Diagnosis not present

## 2022-11-14 ENCOUNTER — Ambulatory Visit: Payer: Medicare HMO | Admitting: Dermatology

## 2023-01-04 DIAGNOSIS — M1711 Unilateral primary osteoarthritis, right knee: Secondary | ICD-10-CM | POA: Diagnosis not present

## 2023-01-09 ENCOUNTER — Other Ambulatory Visit: Payer: Self-pay | Admitting: Orthopedic Surgery

## 2023-01-21 ENCOUNTER — Other Ambulatory Visit: Payer: Self-pay

## 2023-01-21 ENCOUNTER — Encounter
Admission: RE | Admit: 2023-01-21 | Discharge: 2023-01-21 | Disposition: A | Discharge status: Home / Self Care | Payer: Medicare HMO | Source: Ambulatory Visit | Attending: Orthopedic Surgery | Admitting: Orthopedic Surgery

## 2023-01-21 VITALS — BP 138/81 | Resp 16 | Ht 63.0 in | Wt 129.2 lb

## 2023-01-21 DIAGNOSIS — Z01818 Encounter for other preprocedural examination: Secondary | ICD-10-CM | POA: Diagnosis not present

## 2023-01-21 DIAGNOSIS — Z01812 Encounter for preprocedural laboratory examination: Secondary | ICD-10-CM

## 2023-01-21 DIAGNOSIS — I491 Atrial premature depolarization: Secondary | ICD-10-CM | POA: Diagnosis not present

## 2023-01-21 HISTORY — DX: Depression, unspecified: F32.A

## 2023-01-21 LAB — CBC WITH DIFFERENTIAL/PLATELET
Abs Immature Granulocytes: 0.03 10*3/uL (ref 0.00–0.07)
Basophils Absolute: 0.1 10*3/uL (ref 0.0–0.1)
Basophils Relative: 1 %
Eosinophils Absolute: 0 10*3/uL (ref 0.0–0.5)
Eosinophils Relative: 0 %
HCT: 39.1 % (ref 36.0–46.0)
Hemoglobin: 13.2 g/dL (ref 12.0–15.0)
Immature Granulocytes: 0 %
Lymphocytes Relative: 25 %
Lymphs Abs: 2.1 10*3/uL (ref 0.7–4.0)
MCH: 29.9 pg (ref 26.0–34.0)
MCHC: 33.8 g/dL (ref 30.0–36.0)
MCV: 88.7 fL (ref 80.0–100.0)
Monocytes Absolute: 0.6 10*3/uL (ref 0.1–1.0)
Monocytes Relative: 7 %
Neutro Abs: 5.7 10*3/uL (ref 1.7–7.7)
Neutrophils Relative %: 67 %
Platelets: 358 10*3/uL (ref 150–400)
RBC: 4.41 MIL/uL (ref 3.87–5.11)
RDW: 12.8 % (ref 11.5–15.5)
WBC: 8.5 10*3/uL (ref 4.0–10.5)
nRBC: 0 % (ref 0.0–0.2)

## 2023-01-21 LAB — URINALYSIS, ROUTINE W REFLEX MICROSCOPIC
Bacteria, UA: NONE SEEN
Bilirubin Urine: NEGATIVE
Glucose, UA: NEGATIVE mg/dL
Hgb urine dipstick: NEGATIVE
Ketones, ur: 5 mg/dL — AB
Nitrite: NEGATIVE
Protein, ur: NEGATIVE mg/dL
Specific Gravity, Urine: 1.014 (ref 1.005–1.030)
pH: 7 (ref 5.0–8.0)

## 2023-01-21 LAB — COMPREHENSIVE METABOLIC PANEL
ALT: 15 U/L (ref 0–44)
AST: 17 U/L (ref 15–41)
Albumin: 4.3 g/dL (ref 3.5–5.0)
Alkaline Phosphatase: 59 U/L (ref 38–126)
Anion gap: 11 (ref 5–15)
BUN: 21 mg/dL (ref 8–23)
CO2: 28 mmol/L (ref 22–32)
Calcium: 9.8 mg/dL (ref 8.9–10.3)
Chloride: 94 mmol/L — ABNORMAL LOW (ref 98–111)
Creatinine, Ser: 1.01 mg/dL — ABNORMAL HIGH (ref 0.44–1.00)
GFR, Estimated: 56 mL/min — ABNORMAL LOW (ref >60)
Glucose, Bld: 121 mg/dL — ABNORMAL HIGH (ref 70–99)
Potassium: 4.2 mmol/L (ref 3.5–5.1)
Sodium: 133 mmol/L — ABNORMAL LOW (ref 135–145)
Total Bilirubin: 0.8 mg/dL (ref 0.3–1.2)
Total Protein: 7 g/dL (ref 6.5–8.1)

## 2023-01-21 LAB — TYPE AND SCREEN
ABO/RH(D): O POS
Antibody Screen: NEGATIVE

## 2023-01-21 LAB — SURGICAL PCR SCREEN
MRSA, PCR: NEGATIVE
Staphylococcus aureus: POSITIVE — AB

## 2023-01-21 NOTE — Patient Instructions (Addendum)
Your procedure is scheduled on:01/31/23 - Thursday Report to the Registration Desk on the 1st floor of the [facility]. To find out your arrival time, please call [phone] between 1PM - 3PM on: 01/30/23 - Wednesday If your arrival time is 6:00 am, do not arrive before that time as the [facility] entrance doors do not open until 6:00 am.  REMEMBER: Instructions that are not followed completely may result in serious medical risk, up to and including death; or upon the discretion of your surgeon and anesthesiologist your surgery may need to be rescheduled.  Do not eat food after midnight the night before surgery.  No gum chewing or hard candies.  You may however, drink CLEAR liquids up to 2 hours before you are scheduled to arrive for your surgery. Do not drink anything within 2 hours of your scheduled arrival time.  Type 1 and Type 2 diabetics should only drink water.  In addition, your doctor has ordered for you to drink the provided:  Gatorade G2 Drinking this carbohydrate drink up to two hours before surgery helps to reduce insulin resistance and improve patient outcomes. Please complete drinking 2 hours before scheduled arrival time.  One week prior to surgery: Stop Anti-inflammatories (NSAIDS) such as Advil, Aleve, Ibuprofen, Motrin, Naproxen, Naprosyn and Aspirin based products such as Excedrin, Goody's Powder, BC Powder.  Stop ANY OVER THE COUNTER supplements until after surgery.  You may however, continue to take Tylenol if needed for pain up until the day of surgery.  Continue taking all prescribed medications with the exception of the following:  metFORMIN (GLUCOPHAGE) hold beginning 01/29/23. aspirin EC hold beginning 01/24/23.   TAKE ONLY THESE MEDICATIONS THE MORNING OF SURGERY WITH A SIP OF WATER:  omeprazole (PRILOSEC)    No Alcohol for 24 hours before or after surgery.  No Smoking including e-cigarettes for 24 hours before surgery.  No chewable tobacco  products for at least 6 hours before surgery.  No nicotine patches on the day of surgery.  Do not use any "recreational" drugs for at least a week (preferably 2 weeks) before your surgery.  Please be advised that the combination of cocaine and anesthesia may have negative outcomes, up to and including death. If you test positive for cocaine, your surgery will be cancelled.  On the morning of surgery brush your teeth with toothpaste and water, you may rinse your mouth with mouthwash if you wish. Do not swallow any toothpaste or mouthwash.  Use CHG Soap or wipes as directed on instruction sheet.  Do not wear jewelry, make-up, hairpins, clips or nail polish.  Do not wear lotions, powders, or perfumes.   Do not shave body hair from the neck down 48 hours before surgery.  Contact lenses, hearing aids and dentures may not be worn into surgery.  Do not bring valuables to the hospital. [facility] is not responsible for any missing/lost belongings or valuables.   Notify your doctor if there is any change in your medical condition (cold, fever, infection).  Wear comfortable clothing (specific to your surgery type) to the hospital.  After surgery, you can help prevent lung complications by doing breathing exercises.  Take deep breaths and cough every 1-2 hours. Your doctor may order a device called an Incentive Spirometer to help you take deep breaths. When coughing or sneezing, hold a pillow firmly against your incision with both hands. This is called "splinting." Doing this helps protect your incision. It also decreases belly discomfort.  If you are  being admitted to the hospital overnight, leave your suitcase in the car. After surgery it may be brought to your room.  In case of increased patient census, it may be necessary for you, the patient, to continue your postoperative care in the Same Day Surgery department.  If you are being discharged the day of surgery, you will not be allowed to  drive home. You will need a responsible individual to drive you home and stay with you for 24 hours after surgery.   If you are taking public transportation, you will need to have a responsible individual with you.  Please call the [facility]. at [phone] if you have any questions about these instructions.  Surgery Visitation Policy:  Patients undergoing a surgery or procedure may have two family members or support persons with them as long as the person is not COVID-19 positive or experiencing its symptoms.   Inpatient Visitation:    Visiting hours are 7 a.m. to 8 p.m. Up to four visitors are allowed at one time in a patient room. The visitors may rotate out with other people during the day. One designated support person (adult) may remain overnight.  Due to an increase in RSV and influenza rates and associated hospitalizations, children ages 54 and under will not be able to visit patients in [facility].  Masks continue to be strongly recommended.    Preparing for Surgery with CHLORHEXIDINE GLUCONATE (CHG) Soap  Chlorhexidine Gluconate (CHG) Soap  o An antiseptic cleaner that kills germs and bonds with the skin to continue killing germs even after washing  o Used for showering the night before surgery and morning of surgery  Before surgery, you can play an important role by reducing the number of germs on your skin.  CHG (Chlorhexidine gluconate) soap is an antiseptic cleanser which kills germs and bonds with the skin to continue killing germs even after washing.  Please do not use if you have an allergy to CHG or antibacterial soaps. If your skin becomes reddened/irritated stop using the CHG.  1. Shower the NIGHT BEFORE SURGERY and the MORNING OF SURGERY with CHG soap.  2. If you choose to wash your hair, wash your hair first as usual with your normal shampoo.  3. After shampooing, rinse your hair and body thoroughly to remove the  shampoo.  4. Use CHG as you would any other liquid soap. You can apply CHG directly to the skin and wash gently with a scrungie or a clean washcloth.  5. Apply the CHG soap to your body only from the neck down. Do not use on open wounds or open sores. Avoid contact with your eyes, ears, mouth, and genitals (private parts). Wash face and genitals (private parts) with your normal soap.  6. Wash thoroughly, paying special attention to the area where your surgery will be performed.  7. Thoroughly rinse your body with warm water.  8. Do not shower/wash with your normal soap after using and rinsing off the CHG soap.  9. Pat yourself dry with a clean towel.  10. Wear clean pajamas to bed the night before surgery.  12. Place clean sheets on your bed the night of your first shower and do not sleep with pets.  13. Shower again with the CHG soap on the day of surgery prior to arriving at the hospital.  14. Do not apply any deodorants/lotions/powders.  15. Please wear clean clothes to the hospital.  How to Use an Incentive Spirometer  An incentive spirometer is a tool that measures how well you are filling your lungs with each breath. Learning to take long, deep breaths using this tool can help you keep your lungs clear and active. This may help to reverse or lessen your chance of developing breathing (pulmonary) problems, especially infection. You may be asked to use a spirometer: After a surgery. If you have a lung problem or a history of smoking. After a long period of time when you have been unable to move or be active. If the spirometer includes an indicator to show the highest number that you have reached, your health care provider or respiratory therapist will help you set a goal. Keep a log of your progress as told by your health care provider. What are the risks? Breathing too quickly may cause dizziness or cause you to pass out. Take your time so you do not get dizzy or  light-headed. If you are in pain, you may need to take pain medicine before doing incentive spirometry. It is harder to take a deep breath if you are having pain. How to use your incentive spirometer  Sit up on the edge of your bed or on a chair. Hold the incentive spirometer so that it is in an upright position. Before you use the spirometer, breathe out normally. Place the mouthpiece in your mouth. Make sure your lips are closed tightly around it. Breathe in slowly and as deeply as you can through your mouth, causing the piston or the ball to rise toward the top of the chamber. Hold your breath for 3-5 seconds, or for as long as possible. If the spirometer includes a coach indicator, use this to guide you in breathing. Slow down your breathing if the indicator goes above the marked areas. Remove the mouthpiece from your mouth and breathe out normally. The piston or ball will return to the bottom of the chamber. Rest for a few seconds, then repeat the steps 10 or more times. Take your time and take a few normal breaths between deep breaths so that you do not get dizzy or light-headed. Do this every 1-2 hours when you are awake. If the spirometer includes a goal marker to show the highest number you have reached (best effort), use this as a goal to work toward during each repetition. After each set of 10 deep breaths, cough a few times. This will help to make sure that your lungs are clear. If you have an incision on your chest or abdomen from surgery, place a pillow or a rolled-up towel firmly against the incision when you cough. This can help to reduce pain while taking deep breaths and coughing. General tips When you are able to get out of bed: Walk around often. Continue to take deep breaths and cough in order to clear your lungs. Keep using the incentive spirometer until your health care provider says it is okay to stop using it. If you have been in the hospital, you may be told to keep  using the spirometer at home. Contact a health care provider if: You are having difficulty using the spirometer. You have trouble using the spirometer as often as instructed. Your pain medicine is not giving enough relief for you to use the spirometer as told. You have a fever. Get help right away if: You develop shortness of breath. You develop a cough with bloody mucus from the lungs. You have fluid or blood coming from an incision site after you cough. Summary  An incentive spirometer is a tool that can help you learn to take long, deep breaths to keep your lungs clear and active. You may be asked to use a spirometer after a surgery, if you have a lung problem or a history of smoking, or if you have been inactive for a long period of time. Use your incentive spirometer as instructed every 1-2 hours while you are awake. If you have an incision on your chest or abdomen, place a pillow or a rolled-up towel firmly against your incision when you cough. This will help to reduce pain. Get help right away if you have shortness of breath, you cough up bloody mucus, or blood comes from your incision when you cough. This information is not intended to replace advice given to you by your health care provider. Make sure you discuss any questions you have with your health care provider. Document Revised: 02/08/2020 Document Reviewed: 02/08/2020 Elsevier Patient Education  [place].

## 2023-01-30 MED ORDER — ORAL CARE MOUTH RINSE: 15.0000 mL | Freq: Once | OROMUCOSAL | Status: AC

## 2023-01-30 MED ORDER — TRANEXAMIC ACID-NACL 1000-0.7 MG/100ML-% IV SOLN
1000.0000 mg | INTRAVENOUS | Status: AC
  Administered 2023-01-31 (×2): 1000 mg via INTRAVENOUS

## 2023-01-30 MED ORDER — CHLORHEXIDINE GLUCONATE 0.12 % MT SOLN: 15.0000 mL | Freq: Once | OROMUCOSAL | Status: AC

## 2023-01-30 MED ORDER — ACETAMINOPHEN 500 MG PO TABS
1000.0000 mg | ORAL_TABLET | Freq: Once | ORAL | Status: AC
  Administered 2023-01-31: 1000 mg via ORAL

## 2023-01-30 MED ORDER — FAMOTIDINE 20 MG PO TABS: 20.0000 mg | ORAL_TABLET | Freq: Once | ORAL | Status: AC

## 2023-01-30 MED ORDER — DEXAMETHASONE SODIUM PHOSPHATE 10 MG/ML IJ SOLN
8.0000 mg | Freq: Once | INTRAMUSCULAR | Status: AC
  Administered 2023-01-31: 8 mg via INTRAVENOUS

## 2023-01-30 MED ORDER — CEFAZOLIN SODIUM-DEXTROSE 2-4 GM/100ML-% IV SOLN
2.0000 g | INTRAVENOUS | Status: AC
  Administered 2023-01-31: 2 g via INTRAVENOUS

## 2023-01-30 NOTE — Anesthesia Preprocedure Evaluation (Addendum)
Anesthesia Evaluation  Patient identified by MRN, date of birth, ID band Patient awake    Reviewed: Allergy & Precautions, H&P , NPO status , Patient's Chart, lab work & pertinent test results  Airway Mallampati: III  TM Distance: >3 FB Neck ROM: full    Dental  (+) Chipped   Pulmonary neg pulmonary ROS   Pulmonary exam normal        Cardiovascular hypertension, Normal cardiovascular exam     Neuro/Psych  PSYCHIATRIC DISORDERS  Depression    negative neurological ROS     GI/Hepatic Neg liver ROS,GERD  Controlled,,  Endo/Other  diabetes, Well Controlled, Type 2, Oral Hypoglycemic Agents    Renal/GU      Musculoskeletal  (+) Arthritis ,    Abdominal Normal abdominal exam  (+)   Peds  Hematology negative hematology ROS (+)   Anesthesia Other Findings Past Medical History: 12/03/2005: Breast cancer ([place])     Comment:  lumpectomy, XRT, Arimadex 12/04/2003: Colon polyps No date: Depression No date: DM (diabetes mellitus) ([place]) No date: HTN (hypertension) No date: Hyperlipidemia No date: Osteoarthritis No date: Osteopenia No date: Vitamin D deficiency  Past Surgical History: No date: APPENDECTOMY 12/03/2005: BREAST LUMPECTOMY 08/29/2016: COLONOSCOPY; N/A     Comment:  Procedure: COLONOSCOPY;  Surgeon: Daneil Dolin, MD;                Location: [facility];  Service: Endoscopy;                Laterality: N/A;  12:00 pm No date: EYE SURGERY     Comment:  cataract No date: POLYPECTOMY 12/03/2009: VAGINAL HYSTERECTOMY     Comment:  partial     Reproductive/Obstetrics negative OB ROS                             Anesthesia Physical Anesthesia Plan  ASA: 2  Anesthesia Plan: Spinal   Post-op Pain Management: Regional block* and Tylenol PO (pre-op)*   Induction:   PONV Risk Score and Plan: Propofol infusion  Airway Management Planned: Natural Airway  Additional Equipment:    Intra-op Plan:   Post-operative Plan:   Informed Consent: I have reviewed the patients History and Physical, chart, labs and discussed the procedure including the risks, benefits and alternatives for the proposed anesthesia with the patient or authorized representative who has indicated his/her understanding and acceptance.     Dental Advisory Given  Plan Discussed with: CRNA and Surgeon  Anesthesia Plan Comments:         Anesthesia Quick Evaluation

## 2023-01-31 ENCOUNTER — Ambulatory Visit: Payer: Medicare HMO | Admitting: Urgent Care

## 2023-01-31 ENCOUNTER — Other Ambulatory Visit: Payer: Self-pay

## 2023-01-31 ENCOUNTER — Encounter
Admission: RE | Disposition: A | Discharge status: Home Health | Payer: Self-pay | Source: Home / Self Care | Attending: Orthopedic Surgery

## 2023-01-31 ENCOUNTER — Encounter: Payer: Self-pay | Admitting: Orthopedic Surgery

## 2023-01-31 ENCOUNTER — Ambulatory Visit: Payer: Medicare HMO

## 2023-01-31 ENCOUNTER — Observation Stay
Admission: RE | Admit: 2023-01-31 | Discharge: 2023-02-01 | Disposition: A | Discharge status: Home Health | Payer: Medicare HMO | Attending: Orthopedic Surgery | Admitting: Orthopedic Surgery

## 2023-01-31 DIAGNOSIS — Z79899 Other long term (current) drug therapy: Secondary | ICD-10-CM | POA: Diagnosis not present

## 2023-01-31 DIAGNOSIS — Z7982 Long term (current) use of aspirin: Secondary | ICD-10-CM | POA: Diagnosis not present

## 2023-01-31 DIAGNOSIS — Z853 Personal history of malignant neoplasm of breast: Secondary | ICD-10-CM | POA: Diagnosis not present

## 2023-01-31 DIAGNOSIS — M1711 Unilateral primary osteoarthritis, right knee: Principal | ICD-10-CM | POA: Insufficient documentation

## 2023-01-31 DIAGNOSIS — Z96651 Presence of right artificial knee joint: Secondary | ICD-10-CM

## 2023-01-31 DIAGNOSIS — I10 Essential (primary) hypertension: Secondary | ICD-10-CM | POA: Diagnosis not present

## 2023-01-31 DIAGNOSIS — Z7984 Long term (current) use of oral hypoglycemic drugs: Secondary | ICD-10-CM | POA: Insufficient documentation

## 2023-01-31 DIAGNOSIS — K219 Gastro-esophageal reflux disease without esophagitis: Secondary | ICD-10-CM | POA: Diagnosis not present

## 2023-01-31 DIAGNOSIS — Z471 Aftercare following joint replacement surgery: Secondary | ICD-10-CM | POA: Diagnosis not present

## 2023-01-31 DIAGNOSIS — F32A Depression, unspecified: Secondary | ICD-10-CM | POA: Diagnosis not present

## 2023-01-31 DIAGNOSIS — E119 Type 2 diabetes mellitus without complications: Secondary | ICD-10-CM | POA: Diagnosis not present

## 2023-01-31 DIAGNOSIS — Z01812 Encounter for preprocedural laboratory examination: Secondary | ICD-10-CM

## 2023-01-31 DIAGNOSIS — T7840XA Allergy, unspecified, initial encounter: Secondary | ICD-10-CM | POA: Diagnosis not present

## 2023-01-31 HISTORY — PX: TOTAL KNEE ARTHROPLASTY: SHX125

## 2023-01-31 LAB — GLUCOSE, CAPILLARY
Glucose-Capillary: 114 mg/dL — ABNORMAL HIGH (ref 70–99)
Glucose-Capillary: 139 mg/dL — ABNORMAL HIGH (ref 70–99)
Glucose-Capillary: 155 mg/dL — ABNORMAL HIGH (ref 70–99)
Glucose-Capillary: 164 mg/dL — ABNORMAL HIGH (ref 70–99)

## 2023-01-31 SURGERY — ARTHROPLASTY, KNEE, TOTAL
Anesthesia: Spinal | Site: Knee | Laterality: Right

## 2023-01-31 SURGERY — Surgical Case
Anesthesia: *Unknown

## 2023-01-31 MED ORDER — HYDROCODONE-ACETAMINOPHEN 5-325 MG PO TABS: 1.0000 | ORAL_TABLET | ORAL | Status: DC | PRN

## 2023-01-31 MED ORDER — CHLORHEXIDINE GLUCONATE 0.12 % MT SOLN
OROMUCOSAL | Status: AC
  Administered 2023-01-31: 15 mL via OROMUCOSAL
  Filled 2023-01-31: qty 15

## 2023-01-31 MED ORDER — SODIUM CHLORIDE 0.9 % IV SOLN: INTRAVENOUS | Status: DC

## 2023-01-31 MED ORDER — TRIAMTERENE-HCTZ 37.5-25 MG PO TABS
1.0000 | ORAL_TABLET | Freq: Every day | ORAL | Status: DC
  Administered 2023-02-01: 1 via ORAL
  Filled 2023-01-31 (×2): qty 1

## 2023-01-31 MED ORDER — ONDANSETRON HCL 4 MG PO TABS: 4.0000 mg | ORAL_TABLET | Freq: Four times a day (QID) | ORAL | Status: DC | PRN

## 2023-01-31 MED ORDER — MIDAZOLAM HCL 2 MG/2ML IJ SOLN
INTRAMUSCULAR | Status: AC
  Filled 2023-01-31: qty 2

## 2023-01-31 MED ORDER — DROPERIDOL 2.5 MG/ML IJ SOLN: 0.6250 mg | Freq: Once | INTRAMUSCULAR | Status: DC | PRN

## 2023-01-31 MED ORDER — BUPIVACAINE HCL (PF) 0.25 % IJ SOLN
INTRAMUSCULAR | Status: AC
  Filled 2023-01-31: qty 60

## 2023-01-31 MED ORDER — EPINEPHRINE PF 1 MG/ML IJ SOLN
INTRAMUSCULAR | Status: AC
  Filled 2023-01-31: qty 2

## 2023-01-31 MED ORDER — ONDANSETRON HCL 4 MG/2ML IJ SOLN: 4.0000 mg | Freq: Four times a day (QID) | INTRAMUSCULAR | Status: DC | PRN

## 2023-01-31 MED ORDER — OXYCODONE HCL 5 MG PO TABS: 5.0000 mg | ORAL_TABLET | Freq: Once | ORAL | Status: AC | PRN

## 2023-01-31 MED ORDER — PHENOL 1.4 % MT LIQD: 1.0000 | OROMUCOSAL | Status: DC | PRN

## 2023-01-31 MED ORDER — ATORVASTATIN CALCIUM 20 MG PO TABS
40.0000 mg | ORAL_TABLET | Freq: Every evening | ORAL | Status: DC
  Administered 2023-01-31: 40 mg via ORAL
  Filled 2023-01-31: qty 2

## 2023-01-31 MED ORDER — PROMETHAZINE HCL 25 MG/ML IJ SOLN: 6.2500 mg | INTRAMUSCULAR | Status: DC | PRN

## 2023-01-31 MED ORDER — IMIPRAMINE HCL 10 MG PO TABS: 10.0000 mg | ORAL_TABLET | Freq: Every day | ORAL | Status: DC

## 2023-01-31 MED ORDER — DOCUSATE SODIUM 100 MG PO CAPS
100.0000 mg | ORAL_CAPSULE | Freq: Two times a day (BID) | ORAL | Status: DC
  Administered 2023-01-31 – 2023-02-01 (×2): 100 mg via ORAL
  Filled 2023-01-31 (×2): qty 1

## 2023-01-31 MED ORDER — OXYCODONE HCL 5 MG/5ML PO SOLN: 5.0000 mg | Freq: Once | ORAL | Status: AC | PRN

## 2023-01-31 MED ORDER — CEFAZOLIN SODIUM-DEXTROSE 2-4 GM/100ML-% IV SOLN
INTRAVENOUS | Status: AC
  Filled 2023-01-31: qty 100

## 2023-01-31 MED ORDER — PROPOFOL 10 MG/ML IV BOLUS
INTRAVENOUS | Status: AC
  Filled 2023-01-31: qty 20

## 2023-01-31 MED ORDER — TRAMADOL HCL 50 MG PO TABS: 50.0000 mg | ORAL_TABLET | Freq: Four times a day (QID) | ORAL | Status: DC | PRN

## 2023-01-31 MED ORDER — PROPOFOL 500 MG/50ML IV EMUL
INTRAVENOUS | Status: DC | PRN
  Administered 2023-01-31: 75 ug/kg/min via INTRAVENOUS

## 2023-01-31 MED ORDER — INSULIN ASPART 100 UNIT/ML IJ SOLN: 0.0000 [IU] | Freq: Every day | INTRAMUSCULAR | Status: DC

## 2023-01-31 MED ORDER — DOCUSATE SODIUM 100 MG PO CAPS
ORAL_CAPSULE | ORAL | Status: AC
  Administered 2023-01-31: 100 mg via ORAL
  Filled 2023-01-31: qty 1

## 2023-01-31 MED ORDER — KETOROLAC TROMETHAMINE 15 MG/ML IJ SOLN
INTRAMUSCULAR | Status: AC
  Administered 2023-01-31: 7.5 mg via INTRAVENOUS
  Filled 2023-01-31: qty 1

## 2023-01-31 MED ORDER — MAGNESIUM OXIDE 240 MG PO PACK: PACK | Freq: Two times a day (BID) | ORAL | Status: DC

## 2023-01-31 MED ORDER — KETOROLAC TROMETHAMINE 15 MG/ML IJ SOLN
7.5000 mg | Freq: Four times a day (QID) | INTRAMUSCULAR | Status: AC
  Administered 2023-01-31 – 2023-02-01 (×3): 7.5 mg via INTRAVENOUS
  Filled 2023-01-31 (×3): qty 1

## 2023-01-31 MED ORDER — ENALAPRIL MALEATE 5 MG PO TABS: 5.0000 mg | ORAL_TABLET | Freq: Every day | ORAL | Status: DC

## 2023-01-31 MED ORDER — METOCLOPRAMIDE HCL 5 MG/ML IJ SOLN: 5.0000 mg | Freq: Three times a day (TID) | INTRAMUSCULAR | Status: DC | PRN

## 2023-01-31 MED ORDER — PHENYLEPHRINE HCL-NACL 20-0.9 MG/250ML-% IV SOLN
INTRAVENOUS | Status: DC | PRN
  Administered 2023-01-31: 25 ug/min via INTRAVENOUS

## 2023-01-31 MED ORDER — MIDAZOLAM HCL 5 MG/5ML IJ SOLN
INTRAMUSCULAR | Status: DC | PRN
  Administered 2023-01-31: 1 mg via INTRAVENOUS

## 2023-01-31 MED ORDER — ASPIRIN 81 MG PO TBEC
81.0000 mg | DELAYED_RELEASE_TABLET | Freq: Every day | ORAL | Status: DC
  Administered 2023-02-01: 81 mg via ORAL
  Filled 2023-01-31: qty 1

## 2023-01-31 MED ORDER — PHENYLEPHRINE HCL (PRESSORS) 10 MG/ML IV SOLN
INTRAVENOUS | Status: DC | PRN
  Administered 2023-01-31: 80 ug via INTRAVENOUS

## 2023-01-31 MED ORDER — TRANEXAMIC ACID-NACL 1000-0.7 MG/100ML-% IV SOLN
INTRAVENOUS | Status: AC
  Filled 2023-01-31: qty 100

## 2023-01-31 MED ORDER — CEFAZOLIN SODIUM-DEXTROSE 2-4 GM/100ML-% IV SOLN
2.0000 g | Freq: Four times a day (QID) | INTRAVENOUS | Status: AC
  Administered 2023-01-31 (×2): 2 g via INTRAVENOUS
  Filled 2023-01-31 (×6): qty 100

## 2023-01-31 MED ORDER — SODIUM CHLORIDE (PF) 0.9 % IJ SOLN
INTRAMUSCULAR | Status: DC | PRN
  Administered 2023-01-31: 70 mL via INTRAMUSCULAR

## 2023-01-31 MED ORDER — ENOXAPARIN SODIUM 30 MG/0.3ML IJ SOSY
30.0000 mg | PREFILLED_SYRINGE | Freq: Two times a day (BID) | INTRAMUSCULAR | Status: DC
  Administered 2023-02-01: 30 mg via SUBCUTANEOUS
  Filled 2023-01-31: qty 0.3

## 2023-01-31 MED ORDER — METOCLOPRAMIDE HCL 5 MG PO TABS: 5.0000 mg | ORAL_TABLET | Freq: Three times a day (TID) | ORAL | Status: DC | PRN

## 2023-01-31 MED ORDER — FENTANYL CITRATE (PF) 100 MCG/2ML IJ SOLN: 25.0000 ug | INTRAMUSCULAR | Status: DC | PRN

## 2023-01-31 MED ORDER — PANTOPRAZOLE SODIUM 40 MG PO TBEC
DELAYED_RELEASE_TABLET | ORAL | Status: AC
  Administered 2023-01-31: 40 mg via ORAL
  Filled 2023-01-31: qty 1

## 2023-01-31 MED ORDER — TRAZODONE HCL 50 MG PO TABS
50.0000 mg | ORAL_TABLET | Freq: Every day | ORAL | Status: DC
  Administered 2023-01-31: 50 mg via ORAL
  Filled 2023-01-31: qty 1

## 2023-01-31 MED ORDER — SURGIPHOR WOUND IRRIGATION SYSTEM - OPTIME: TOPICAL | Status: DC | PRN

## 2023-01-31 MED ORDER — SODIUM CHLORIDE 0.9 % IR SOLN
Status: DC | PRN
  Administered 2023-01-31: 3000 mL

## 2023-01-31 MED ORDER — BUPIVACAINE LIPOSOME 1.3 % IJ SUSP
INTRAMUSCULAR | Status: AC
  Filled 2023-01-31: qty 40

## 2023-01-31 MED ORDER — ORAL CARE MOUTH RINSE: 15.0000 mL | OROMUCOSAL | Status: DC | PRN

## 2023-01-31 MED ORDER — PROPOFOL 10 MG/ML IV BOLUS
INTRAVENOUS | Status: DC | PRN
  Administered 2023-01-31: 30 mg via INTRAVENOUS

## 2023-01-31 MED ORDER — ONDANSETRON HCL 4 MG/2ML IJ SOLN
INTRAMUSCULAR | Status: DC | PRN
  Administered 2023-01-31: 4 mg via INTRAVENOUS

## 2023-01-31 MED ORDER — OXYCODONE HCL 5 MG PO TABS
ORAL_TABLET | ORAL | Status: AC
  Administered 2023-01-31: 5 mg via ORAL
  Filled 2023-01-31: qty 1

## 2023-01-31 MED ORDER — ACETAMINOPHEN 500 MG PO TABS
1000.0000 mg | ORAL_TABLET | Freq: Three times a day (TID) | ORAL | Status: DC
  Administered 2023-01-31 – 2023-02-01 (×3): 1000 mg via ORAL
  Filled 2023-01-31 (×3): qty 2

## 2023-01-31 MED ORDER — INSULIN ASPART 100 UNIT/ML IJ SOLN
0.0000 [IU] | Freq: Three times a day (TID) | INTRAMUSCULAR | Status: DC
  Administered 2023-01-31: 3 [IU] via SUBCUTANEOUS
  Filled 2023-01-31: qty 1

## 2023-01-31 MED ORDER — PHENYLEPHRINE HCL-NACL 20-0.9 MG/250ML-% IV SOLN
INTRAVENOUS | Status: AC
  Filled 2023-01-31: qty 250

## 2023-01-31 MED ORDER — IRBESARTAN 150 MG PO TABS
300.0000 mg | ORAL_TABLET | Freq: Every day | ORAL | Status: DC
  Administered 2023-02-01: 300 mg via ORAL
  Filled 2023-01-31: qty 2

## 2023-01-31 MED ORDER — MORPHINE SULFATE (PF) 4 MG/ML IV SOLN: 0.5000 mg | INTRAVENOUS | Status: DC | PRN

## 2023-01-31 MED ORDER — ACETAMINOPHEN 500 MG PO TABS
ORAL_TABLET | ORAL | Status: AC
  Filled 2023-01-31: qty 2

## 2023-01-31 MED ORDER — PROPOFOL 1000 MG/100ML IV EMUL
INTRAVENOUS | Status: AC
  Filled 2023-01-31: qty 100

## 2023-01-31 MED ORDER — FENTANYL CITRATE (PF) 100 MCG/2ML IJ SOLN
INTRAMUSCULAR | Status: AC
  Filled 2023-01-31: qty 2

## 2023-01-31 MED ORDER — BUPIVACAINE HCL (PF) 0.5 % IJ SOLN
INTRAMUSCULAR | Status: DC | PRN
  Administered 2023-01-31: 2.5 mL via INTRATHECAL

## 2023-01-31 MED ORDER — DEXAMETHASONE SODIUM PHOSPHATE 10 MG/ML IJ SOLN
INTRAMUSCULAR | Status: AC
  Filled 2023-01-31: qty 1

## 2023-01-31 MED ORDER — SODIUM CHLORIDE FLUSH 0.9 % IV SOLN
INTRAVENOUS | Status: AC
  Filled 2023-01-31: qty 40

## 2023-01-31 MED ORDER — MENTHOL 3 MG MT LOZG: 1.0000 | LOZENGE | OROMUCOSAL | Status: DC | PRN

## 2023-01-31 MED ORDER — TRANEXAMIC ACID 1000 MG/10ML IV SOLN
INTRAVENOUS | Status: AC
  Filled 2023-01-31: qty 10

## 2023-01-31 MED ORDER — FAMOTIDINE 20 MG PO TABS
ORAL_TABLET | ORAL | Status: AC
  Administered 2023-01-31: 20 mg via ORAL
  Filled 2023-01-31: qty 1

## 2023-01-31 MED ORDER — ACETAMINOPHEN 10 MG/ML IV SOLN: 1000.0000 mg | Freq: Once | INTRAVENOUS | Status: DC | PRN

## 2023-01-31 MED ORDER — PANTOPRAZOLE SODIUM 40 MG PO TBEC
40.0000 mg | DELAYED_RELEASE_TABLET | Freq: Every day | ORAL | Status: DC
  Administered 2023-02-01: 40 mg via ORAL
  Filled 2023-01-31: qty 1

## 2023-01-31 SURGICAL SUPPLY — 81 items
ADH SKN CLS APL DERMABOND .7 (GAUZE/BANDAGES/DRESSINGS) ×1
APL PRP STRL LF DISP 70% ISPRP (MISCELLANEOUS) ×2
BLADE PATELLA REAM PILOT HOLE (MISCELLANEOUS) IMPLANT
BLADE SAW 90X13X1.19 OSCILLAT (BLADE) IMPLANT
BLADE SAW SAG 25X90X1.19 (BLADE) ×1 IMPLANT
BLADE SAW SAG 29X58X.64 (BLADE) ×1 IMPLANT
BNDG ELASTIC 6X5.8 VLCR STR LF (GAUZE/BANDAGES/DRESSINGS) ×1 IMPLANT
BOWL CEMENT MIX W/ADAPTER (MISCELLANEOUS) ×1 IMPLANT
BSPLAT TIB 5D D CMNT STM RT (Knees) ×1 IMPLANT
CEMENT BONE R 1X40 (Cement) ×2 IMPLANT
CHLORAPREP W/TINT 26 (MISCELLANEOUS) ×2 IMPLANT
COMP FEM CR CEMT STD SZ5 (Joint) ×1 IMPLANT
COMPONENT FEM CR CEMT STD SZ5 (Joint) IMPLANT
COOLER POLAR GLACIER W/PUMP (MISCELLANEOUS) ×1 IMPLANT
CUFF TOURN SGL QUICK 24 (TOURNIQUET CUFF)
CUFF TOURN SGL QUICK 34 (TOURNIQUET CUFF)
CUFF TRNQT CYL 24X4X16.5-23 (TOURNIQUET CUFF) IMPLANT
CUFF TRNQT CYL 34X4.125X (TOURNIQUET CUFF) IMPLANT
DERMABOND ADVANCED .7 DNX12 (GAUZE/BANDAGES/DRESSINGS) ×1 IMPLANT
DRAPE 3/4 80X56 (DRAPES) ×1 IMPLANT
DRAPE INCISE IOBAN 66X60 STRL (DRAPES) ×1 IMPLANT
DRSG MEPILEX SACRM 8.7X9.8 (GAUZE/BANDAGES/DRESSINGS) ×1 IMPLANT
DRSG OPSITE POSTOP 4X10 (GAUZE/BANDAGES/DRESSINGS) IMPLANT
DRSG OPSITE POSTOP 4X8 (GAUZE/BANDAGES/DRESSINGS) IMPLANT
ELECT CAUTERY BLADE 6.4 (BLADE) ×1 IMPLANT
ELECT REM PT RETURN 9FT ADLT (ELECTROSURGICAL) ×1
ELECTRODE REM PT RTRN 9FT ADLT (ELECTROSURGICAL) ×1 IMPLANT
GLOVE BIO SURGEON STRL SZ8 (GLOVE) ×1 IMPLANT
GLOVE BIOGEL PI IND STRL 8 (GLOVE) ×1 IMPLANT
GLOVE PI ORTHO PRO STRL 7.5 (GLOVE) ×2 IMPLANT
GLOVE PI ORTHO PRO STRL SZ8 (GLOVE) ×2 IMPLANT
GOWN STRL REUS W/ TWL LRG LVL3 (GOWN DISPOSABLE) ×1 IMPLANT
GOWN STRL REUS W/ TWL XL LVL3 (GOWN DISPOSABLE) ×2 IMPLANT
GOWN STRL REUS W/TWL LRG LVL3 (GOWN DISPOSABLE) ×1
GOWN STRL REUS W/TWL XL LVL3 (GOWN DISPOSABLE) ×2
HANDLE YANKAUER SUCT OPEN TIP (MISCELLANEOUS) ×1 IMPLANT
HDLS TROCR DRIL PIN KNEE 75 (PIN) ×1
HOLDER FOLEY CATH W/STRAP (MISCELLANEOUS) ×1 IMPLANT
HOOD PEEL AWAY T7 (MISCELLANEOUS) ×3 IMPLANT
INSERT TIB AS PERS SZ CD 12 RT (Insert) IMPLANT
IV NS IRRIG 3000ML ARTHROMATIC (IV SOLUTION) ×1 IMPLANT
KIT TURNOVER KIT A (KITS) ×1 IMPLANT
MANIFOLD NEPTUNE II (INSTRUMENTS) ×1 IMPLANT
MARKER SKIN DUAL TIP RULER LAB (MISCELLANEOUS) ×1 IMPLANT
MAT ABSORB  FLUID 56X50 GRAY (MISCELLANEOUS) ×1
MAT ABSORB FLUID 56X50 GRAY (MISCELLANEOUS) ×1 IMPLANT
NDL HYPO 21X1.5 SAFETY (NEEDLE) ×1 IMPLANT
NDL SAFETY ECLIP 18X1.5 (MISCELLANEOUS) ×1 IMPLANT
NEEDLE HYPO 21X1.5 SAFETY (NEEDLE) ×1 IMPLANT
NS IRRIG 500ML POUR BTL (IV SOLUTION) ×1 IMPLANT
PACK TOTAL KNEE (MISCELLANEOUS) ×1 IMPLANT
PAD WRAPON POLAR KNEE (MISCELLANEOUS) ×1 IMPLANT
PENCIL SMOKE EVACUATOR (MISCELLANEOUS) ×1 IMPLANT
PIN DRILL HDLS TROCAR 75 4PK (PIN) IMPLANT
PULSAVAC PLUS IRRIG FAN TIP (DISPOSABLE) ×1
SCREW FEMALE HEX FIX 25X2.5 (ORTHOPEDIC DISPOSABLE SUPPLIES) IMPLANT
SCREW HEX HEADED 3.5X27 DISP (ORTHOPEDIC DISPOSABLE SUPPLIES) IMPLANT
SLEEVE SCD COMPRESS KNEE MED (STOCKING) ×1 IMPLANT
SOLUTION IRRIG SURGIPHOR (IV SOLUTION) ×1 IMPLANT
STEM POLY PAT PLY 29M KNEE (Knees) IMPLANT
STEM TIB ST PERS 14+30 (Stem) IMPLANT
STEM TIBIA 5 DEG SZ D R KNEE (Knees) IMPLANT
SUT DVC 2 QUILL PDO  T11 36X36 (SUTURE) ×1
SUT DVC 2 QUILL PDO T11 36X36 (SUTURE) ×1 IMPLANT
SUT QUILL MONODERM 3-0 PS-2 (SUTURE) ×1 IMPLANT
SUT VIC AB 0 CT1 36 (SUTURE) ×1 IMPLANT
SUT VIC AB 2-0 CT2 27 (SUTURE) ×2 IMPLANT
SUT VICRYL 1-0 27IN ABS (SUTURE) ×1
SUTURE VICRYL 1-0 27IN ABS (SUTURE) ×1 IMPLANT
SYR 10ML LL (SYRINGE) ×1 IMPLANT
SYR 30ML LL (SYRINGE) ×2 IMPLANT
SYR 3ML LL SCALE MARK (SYRINGE) ×1 IMPLANT
TAPE CLOTH 3X10 WHT NS LF (GAUZE/BANDAGES/DRESSINGS) ×1 IMPLANT
TIBIA STEM 5 DEG SZ D R KNEE (Knees) ×1 IMPLANT
TIP FAN IRRIG PULSAVAC PLUS (DISPOSABLE) ×1 IMPLANT
TOWEL OR 17X26 4PK STRL BLUE (TOWEL DISPOSABLE) IMPLANT
TRAP FLUID SMOKE EVACUATOR (MISCELLANEOUS) ×1 IMPLANT
TRAY FOLEY SLVR 16FR LF STAT (SET/KITS/TRAYS/PACK) ×1 IMPLANT
TUBE KAMVAC SUCTION (TUBING) IMPLANT
WATER STERILE IRR 1000ML POUR (IV SOLUTION) ×1 IMPLANT
WRAPON POLAR PAD KNEE (MISCELLANEOUS) ×1

## 2023-01-31 NOTE — Care Management Obs Status (Signed)
[facility] NOTIFICATION   Patient Details  Name: JIANNA GIUDICE MRN: [id number] Date of Birth: 09-07-1941   Medicare Observation Status Notification Given:  Yes    Carol Ada, RN 01/31/2023, 2:01 PM

## 2023-01-31 NOTE — Op Note (Signed)
Patient Name: Brenda Landry  [id number]  Pre-Operative Diagnosis: Right knee Osteoarthritis  Post-Operative Diagnosis: (same)  Procedure: Right Total Knee Arthroplasty  Components/Implants: Femur: Persona Size 5 CR   Tibia: Persona Size D w14x23m stem extension  Poly: Size 129mMC  Patella: 29x8m11mymmetric   Femoral Valgus Cut Angle: 5 degrees  Distal Femoral Re-cut: none  Patella Resurfacing: yes   Date of Surgery: 01/31/2023  Surgeon: ZacSteffanie Rainwater  Assistant: ThoDorise Hissresent and scrubbed throughout the case, critical for assistance with exposure, retraction, instrumentation, and closure)   Anesthesiologist: JohWynetta Emerynesthesia: Spinal   Tourniquet Time: 58 min  EBL: 75cc  IVF: [id number]: None   Brief history: The patient is a 81 30ar old female with a history of osteoarthritis of the right knee with pain limiting their range of motion and activities of daily living, which has failed multiple attempts at conservative therapy.  The risks and benefits of total knee arthroplasty as definitive surgical treatment were discussed with the patient, who opted to proceed with the operation.  After outpatient medical clearance and optimization was completed the [facility]  All preoperative films were reviewed and an appropriate surgical plan was made prior to surgery. Preoperative range of motion was 5 to 100 with a 5 degree flexion contracture.   Description of procedure: The patient was brought to the operating room where laterality was confirmed by all those present to be the right side.   Spinal anesthesia was administered and the patient received an intravenous dose of antibiotics for surgical prophylaxis and a dose of tranexamic acid.  Patient is positioned supine on the operating room table with all bony prominences well-padded.  A well-padded tourniquet was applied to the right thigh.   The knee was then prepped and draped in usual sterile fashion with multiple layers of adhesive and nonadhesive drapes.  All of those present in the operating room participated in a surgical timeout laterality and patient were confirmed.   An Esmarch was wrapped around the extremity and the leg was elevated and the knee flexed.  The tourniquet was inflated to a pressure of 275 mmHg. The Esmarch was removed and the leg was brought down to full extension.  The patella and tibial tubercle identified and outlined using a marking pen and a midline skin incision was made with a knife carried through the subcutaneous tissue down to the extensor retinaculum.  After exposure of the extensor mechanism the medial parapatellar arthrotomy was performed with a scalpel and electrocautery extending down medial and distal to the tibial tubercle taking care to avoid incising the patellar tendon.   A standard medial release was performed over the proximal tibia.  The knee was brought into extension in order to excise the fat pad taking care not to damage the patella tendon.  The superior soft tissue was removed from the anterior surface of the distal femur to visualize for the procedure.  The knee was then brought into flexion with the patella subluxed laterally and subluxing the tibia anteriorly.  The ACL was transected and removed with electrocautery and additional soft tissue was removed from the proximal surface of the tibia to fully expose. The PCL was found to be intact and was preserved.  An extramedullary tibial cutting guide was then applied to the leg with a spring-loaded ankle clamp placed around the distal tibia just above the malleoli the angulation of the guide was adjusted to give some posterior  slope in the tibial resection with an appropriate varus/valgus alignment.  The resection guide was then pinned to the proximal tibia and the proximal tibial surface was resected with an oscillating saw.  Careful attention  was paid to ensure the blade did not disrupt any of the soft tissues including any lateral or medial ligament.  Attention was then turned to the femur, with the knee slightly flexed a opening drill was used to enter the medullary canal of the femur.  After removing the drill marrow was suctioned out to decompress the distal femur.  An intramedullary femoral guide was then inserted into the drill hole and the alignment guide was seated firmly against the distal end of the medial femoral condyle.  The distal femoral cutting guide was then attached and pinned securely to the anterior surface of the femur and the intramedullary rod and alignment guide was removed.  Distal femur resection was then performed with an oscillating saw with retractors protecting medial and laterally.   The distal cutting block was then removed and the extension gap was checked with a spacer.  Extension gap was found to be appropriately sized to accommodate the spacer block.   The femoral sizing guide was then placed securely into the posterior condyles of the femur and the femoral size was measured and determined to be 5.  The size 5; 4-in-1 cutting guide was placed in position and secured with 2 pins.  The anterior posterior and chamfer resections were then performed with an oscillating saw.  Bony fragments and osteophytes were then removed.  Using a lamina spreader the posterior medial and lateral condyles were checked for additional osteophytes and posterior soft tissue remnants.  Any remaining meniscus was removed at this time.  Periarticular injection was performed in the meniscal rims and posterior capsule with aspiration performed to ensure no intravascular injection.   The tibia was then exposed and the tibial trial was pinned onto the plateau after confirming appropriate orientation and rotation.  Using the drill bushing the tibia was prepared to the appropriate drill depth.  Tibial broach impactor was then driven through the  punch guide using a mallet.  The femoral trial component was then inserted onto the femur. A trial tibial polyethylene bearing was then placed and the knee was reduced.  The knee achieved full extension with no hyperextension and was found to be balanced in flexion and extension with the trials in place.  The knee was then brought into full extension the patella was everted and held with 2 Kocher clamps.  The articular surface of the patella was then resected with an patella reamer and saw after careful measurement with a caliper.  The patella was then prepared with the drill guide and a trial patella was placed.  The knee was then taken through range of motion and it was found that the patella articulated appropriately with the trochlea and good patellofemoral motion without subluxation.    The correct final components for implantation were confirmed and opened by the circulator nurse.  The prepared surfaces of the patella femur and tibia were cleaned with pulsatile lavage to remove all blood fat and other material and then the surfaces were dried.  2 bags of cement were mixed under vacuum and the components were cemented into place.  Excess cement was removed with curettes and forceps. A trial polyethylene tibial component was placed and the knee was brought into extension to allow the cement to set.  At this time the periarticular injection cocktail  was placed in the soft tissues surrounding the knee.  After full curing of the cement the balance of the knee was checked again and the final polyethylene size was confirmed. The tibial component was irrigated and locking mechanism checked to ensure it was clear of debris. The real polyethylene tibial component was implanted and the knee was brought through a range of motion.   The knee was then irrigated with copious amount of normal saline via pulsatile lavage to remove all loose bodies and other debris.  The knee was then irrigated with surgiphor betadine based  wash and reirrigated with saline.  The tourniquet was then dropped and all bleeding vessels were identified and coagulated.  The arthrotomy was approximated with #1 Vicryl and closed with #2 Quill suture.  The knee was brought into slight flexion and the subcutaneous tissues were closed with 0 Vicryl, 2-0 Vicryl and a running subcuticular 3-0 monoderm quil suture.  Skin was then glued with Dermabond.  A sterile adhesive dressing was then placed along with a sequential compression device to the calf, a Ted stocking, and a cryotherapy cuff.   Sponge, needle, and Lap counts were all correct at the end of the case.   The patient was transferred off of the operating room table to a hospital bed, good pulses were found distally on the operative side.  The patient was transferred to the recovery room in stable condition.

## 2023-01-31 NOTE — Progress Notes (Signed)
Patient is not able to walk the distance required to go the bathroom, or he/she is unable to safely negotiate stairs required to access the bathroom.  A 3in1 BSC will alleviate this problem  

## 2023-01-31 NOTE — H&P (Signed)
History of Present Illness: The patient is an 82 y.o. female seen in clinic today for evaluation of her right knee. The patient reports she has had pain in her right knee for years localized over the anterior medial and anterior lateral knee. She reports she is having knee buckling and giving way and that her pain and dysfunction are affecting her activities of daily living and preventing her from walking or going to the store. She reports the pain at its worst is an 8 out of 10 described as sharp. She has been treated with anti-inflammatories and injections most recent of which was a cortisone injection 10/30/2022. She reports she got about 2 weeks of relief from that injection before her knee pain significantly worsened. At this point she feels like the conservative measures are not helping her knee and she is having worsening function. The patient denies fevers, chills, numbness, tingling, shortness of breath, chest pain, recent illness, or any trauma.  The patient is a diabetic with her last A1c of 6.5, her BMI is 25.6 and she is a non-smoker.  Past Medical History: Past Medical History:  Diagnosis Date  Breast cancer (CMS-HCC)  Diabetes mellitus type 2, uncomplicated (CMS-HCC)  Hyperlipidemia  Hypertension   Past Surgical History: Past Surgical History:  Procedure Laterality Date  APPENDECTOMY  HYSTERECTOMY  MASTECTOMY   Past Family History: Family History  Problem Relation Age of Onset  Diabetes type II Mother  High blood pressure (Hypertension) Father  Coronary Artery Disease (Blocked arteries around heart) Father   Medications: Current Outpatient Medications Ordered in Epic  Medication Sig Dispense Refill  aspirin 325 MG tablet Take by mouth.  atorvastatin (LIPITOR) 40 MG tablet TAKE 1 TABLET BY MOUTH AT BEDTIME FOR CHOLESTEROL 90 tablet 3  CONTOUR TEST STRIPS test strip CHECK BLOOD SUGAR TWICE DAILY. 100 each 5  imipramine (TOFRANIL) 10 MG tablet TAKE (1) TABLET BY MOUTH  DAILY AT BEDTIME. 90 tablet 3  magnesium oxide (MAG-OX) 400 mg (241.3 mg magnesium) tablet Take 1 tablet (400 mg total) by mouth 4 (four) times daily 360 tablet 1  metFORMIN (GLUCOPHAGE) 1000 MG tablet TAKE (1) TABLET BY MOUTH TWICE A DAY WITH MEALS (BREAKFAST AND SUPPER) 180 tablet 0  omeprazole (PRILOSEC) 40 MG DR capsule Take 1 capsule (40 mg total) by mouth once daily 90 capsule 3  traZODone (DESYREL) 50 MG tablet TAKE ONE TABLET BY MOUTH AT BEDTIME AS NEEDED FOR SLEEP 90 tablet 3  triamterene-hydroCHLOROthiazide (DYAZIDE) 37.5-25 mg capsule TAKE (1) CAPSULE BY MOUTH ONCE DAILY. 90 capsule 3  valsartan (DIOVAN) 320 MG tablet TAKE 1 TABLET BY MOUTH ONCE DAILY. 90 tablet 3   No current Epic-ordered facility-administered medications on file.   Allergies: Allergies  Allergen Reactions  Codeine Itching    Review of Systems:  A comprehensive 14 point ROS was performed, reviewed, and the pertinent orthopaedic findings are documented in the HPI.  Physical Exam: General/Constitutional: No apparent distress: well-nourished and well developed. Eyes: Pupils equal, round with synchronous movement. Pulmonary exam: Lungs clear to auscultation bilaterally no wheezing rales or rhonchi Cardiac exam: Regular rate and rhythm no obvious murmurs rubs or gallops. Integumentary: No impressive skin lesions present, except as noted in detailed exam. Neuro/Psych: Normal mood and affect, oriented to person, place and time.  Comprehensive Knee Exam: Gait Antalgic and stiff  Alignment Neutral   Inspection Right Left  Skin Normal appearance with no obvious deformity. No ecchymosis or erythema. Normal appearance with no obvious deformity. No ecchymosis or erythema.  Soft Tissue No focal soft tissue swelling No focal soft tissue swelling  Quad Atrophy None None   Palpation  Right Left  Tenderness Medial, lateral joint line, and parapatellar tenderness to palpation Mild medial joint line tenderness   Crepitus + patellofemoral and tibiofemoral crepitus No patellofemoral or tibiofemoral crepitus  Effusion None None   Range of Motion Right Left  Flexion 5-100 5-115  Extension Full knee extension without hyperextension Full knee extension without hyperextension   Ligamentous Exam Right Left  Lachman Normal Normal  Valgus 0 Normal Normal  Valgus 30 Normal Normal  Varus 0 Normal Normal  Varus 30 Normal Normal  Anterior Drawer Normal Normal  Posterior Drawer Normal Normal   Meniscal Exam Right Left  Hyperflexion Test Positive Negative  Hyperextension Test Positive Negative  McMurray's Negative Negative   Neurovascular Right Left  Quadriceps Strength 5/5 5/5  Hamstring Strength 5/5 5/5  Hip Abductor Strength 4/5 4/5  Distal Motor Normal Normal  Distal Sensory Normal light touch sensation Normal light touch sensation  Distal Pulses Normal Normal    Imaging Studies: I have reviewed AP, lateral,sunrise, and flexed PA weight bearing knee X-rays (4 views) of the right knee ordered and taken today in the office show severe degenerative changes with lateral, medial, and patellofemoral joint space narrowing with bone on bone articulation, osteophyte formation, subchondral cysts and sclerosis. Measured in 10 degrees of valgus. Kellgren-Lawrence grade 4. AP, sunrise, and flexed PA x-rays of the left knee show medial joint space narrowing with sclerosis, osteophyte formation, early subchondral cyst formation. Kellgren-Lawrence grade 3.  Assessment:  ICD-10-CM  1. Primary osteoarthritis of right knee M17.11    Plan: Ehlana is an 82 year old female who presents with right knee bone on bone arthritis. Based upon the patient's continued symptoms and failure to respond to conservative treatment, I have recommended a right total knee replacement for this patient. A long discussion took place with the patient describing what a total joint replacement is and what the procedure would entail. A  knee model, similar to the implants that will be used during the operation, was utilized to demonstrate the implants. Choices of implant manufactures were discussed and reviewed. The ability to secure the implant utilizing cement or cementless (press fit) fixation was discussed. The approach and exposure was discussed.   The hospitalization and post-operative care and rehabilitation were also discussed. The use of perioperative antibiotics and DVT prophylaxis were discussed. The risk, benefits and alternatives to a surgical intervention were discussed at length with the patient. The patient was also advised of risks related to the medical comorbidities and elevated body mass index (BMI). A lengthy discussion took place to review the most common complications including but not limited to: stiffness, loss of function, complex regional pain syndrome, deep vein thrombosis, pulmonary embolus, heart attack, stroke, infection, wound breakdown, numbness, intraoperative fracture, damage to nerves, tendon,muscles, arteries or other blood vessels, death and other possible complications from anesthesia. The patient was told that we will take steps to minimize these risks by using sterile technique, antibiotics and DVT prophylaxis when appropriate and follow the patient postoperatively in the office setting to monitor progress. The possibility of recurrent pain, no improvement in pain and actual worsening of pain were also discussed with the patient.   The discharge plan of care focused on the patient going home following surgery. The patient was encouraged to make the necessary arrangements to have someone stay with them when they are discharged home.   The benefits of surgery  were discussed with the patient including the potential for improving the patient's current clinical condition through operative intervention. Alternatives to surgical intervention including continued conservative management were also discussed in  detail. All questions were answered to the satisfaction of the patient. The patient participated and agreed to the plan of care as well as the use of the recommended implants for their total knee replacement surgery. An information packet was given to the patient to review prior to surgery.   Patient received medical clearance for surgery. All questions answered the patient agrees above plan to proceed with surgical planning for a right total knee replacement.   Portions of this record have been created using [employer]. Dictation errors have been sought, but may not have been identified and corrected.  Steffanie Rainwater MD

## 2023-01-31 NOTE — Anesthesia Postprocedure Evaluation (Signed)
Anesthesia Post Note  Patient: Brenda Landry  Procedure(s) Performed: TOTAL KNEE ARTHROPLASTY (Right: Knee)  Patient location during evaluation: PACU Anesthesia Type: Spinal Level of consciousness: awake and alert Pain management: pain level controlled Vital Signs Assessment: post-procedure vital signs reviewed and stable Respiratory status: spontaneous breathing, nonlabored ventilation and respiratory function stable Cardiovascular status: blood pressure returned to baseline and stable Postop Assessment: no apparent nausea or vomiting Anesthetic complications: no   No notable events documented.   Last Vitals:  Vitals:   01/31/23 1300 01/31/23 1344  BP: 113/87 (!) 134/59  Pulse: 72 78  Resp: 18 15  Temp: (!) 36.4 C 36.6 C  SpO2: 97% 99%    Last Pain:  Vitals:   01/31/23 1300  TempSrc:   PainSc: 0-No pain                 Iran Ouch

## 2023-01-31 NOTE — Transfer of Care (Signed)
Immediate Anesthesia Transfer of Care Note  Patient: Brenda Landry  Procedure(s) Performed: TOTAL KNEE ARTHROPLASTY (Right: Knee)  Patient Location: PACU  Anesthesia Type:Spinal  Level of Consciousness: awake, drowsy, and patient cooperative  Airway & Oxygen Therapy: Patient Spontanous Breathing  Post-op Assessment: Report given to RN and Post -op Vital signs reviewed and stable  Post vital signs: Reviewed and stable  Last Vitals:  Vitals Value Taken Time  BP 119/48 01/31/23 0938  Temp 36.8 C 01/31/23 0938  Pulse 74 01/31/23 0939  Resp 29 01/31/23 0939  SpO2 96 % 01/31/23 0939  Vitals shown include unvalidated device data.  Last Pain:  Vitals:   01/31/23 0605  TempSrc: Oral  PainSc: 6          Complications: No notable events documented.

## 2023-01-31 NOTE — Anesthesia Procedure Notes (Signed)
Spinal  Patient location during procedure: OR Start time: 01/31/2023 7:30 AM End time: 01/31/2023 7:35 AM Reason for block: surgical anesthesia Staffing Performed: resident/CRNA  Anesthesiologist: Iran Ouch, MD Resident/CRNA: Lowry Bowl, CRNA Performed by: Lowry Bowl, CRNA Authorized by: Iran Ouch, MD   Preanesthetic Checklist Completed: patient identified, IV checked, site marked, risks and benefits discussed, surgical consent, monitors and equipment checked, pre-op evaluation and timeout performed Spinal Block Patient position: sitting Prep: DuraPrep Patient monitoring: heart rate, cardiac monitor, continuous pulse ox and blood pressure Approach: midline Location: L3-4 Injection technique: single-shot Needle Needle type: Whitacre  Needle gauge: 22 G Needle length: 9 cm Assessment Sensory level: T4 Events: CSF return Additional Notes First attempt hit os.  Second attempt successful.

## 2023-01-31 NOTE — Evaluation (Signed)
Physical Therapy Evaluation Patient Details Name: Brenda Landry MRN: [id number] DOB: 1941/02/26 Today's Date: 01/31/2023  History of Present Illness  Pt admitted for R TKR. HIstory of DM, breast ca, HLD, HTN and is POD 0 at time of evaluation.  Clinical Impression  Pt is a pleasant 82 year old female who was admitted for R TKR. Pt performs bed mobility, transfers, and ambulation with cga and RW. Pt demonstrates ability to perform 10 SLRs with independence, therefore does not require KI for mobility. Pt demonstrates deficits with strength/pain/mobility. Would benefit from skilled PT to address above deficits and promote optimal return to PLOF. Recommend transition to [facility] upon discharge from acute hospitalization.       Recommendations for follow up therapy are one component of a multi-disciplinary discharge planning process, led by the attending physician.  Recommendations may be updated based on patient status, additional functional criteria and insurance authorization.  Follow Up Recommendations Home health PT      Assistance Recommended at Discharge Set up Supervision/Assistance  Patient can return home with the following  A little help with walking and/or transfers;A little help with bathing/dressing/bathroom;Help with stairs or ramp for entrance    Equipment Recommendations Rolling walker (2 wheels);BSC/3in1  Recommendations for Other Services       Functional Status Assessment Patient has had a recent decline in their functional status and demonstrates the ability to make significant improvements in function in a reasonable and predictable amount of time.     Precautions / Restrictions Precautions Precautions: Fall;Knee Precaution Booklet Issued: No Restrictions Weight Bearing Restrictions: Yes RLE Weight Bearing: Weight bearing as tolerated      Mobility  Bed Mobility Overal bed mobility: Needs Assistance Bed Mobility: Supine to Sit     Supine to sit: Min guard      General bed mobility comments: safe technique with guidance for R LE    Transfers Overall transfer level: Needs assistance Equipment used: Rolling walker (2 wheels) Transfers: Sit to/from Stand Sit to Stand: Min guard           General transfer comment: transfers performed with RW and upright posture. Good weight acceptance on surgical leg    Ambulation/Gait Ambulation/Gait assistance: Min guard Gait Distance (Feet): 40 Feet Assistive device: Rolling walker (2 wheels) Gait Pattern/deviations: Step-through pattern       General Gait Details: ambulated around room with step to gait pattern. Safe technique  Financial trader Rankin (Stroke Patients Only)       Balance Overall balance assessment: Needs assistance Sitting-balance support: Feet supported Sitting balance-Leahy Scale: Good     Standing balance support: Bilateral upper extremity supported Standing balance-Leahy Scale: Good                               Pertinent Vitals/Pain Pain Assessment Pain Assessment: 0-10 Pain Score: 5  Pain Location: R knee Pain Descriptors / Indicators: Operative site guarding Pain Intervention(s): Limited activity within patient's tolerance, Repositioned, Ice applied    Home Living Family/patient expects to be discharged to:: Private residence Living Arrangements: Children;Other relatives Available Help at Discharge: Family Type of Home: House Home Access: Stairs to enter Entrance Stairs-Rails: None Entrance Stairs-Number of Steps: 1   Home Layout: One level Home Equipment: None      Prior Function Prior Level of Function : Independent/Modified Independent  Mobility Comments: very active and no falls ADLs Comments: indep     Hand Dominance        Extremity/Trunk Assessment   Upper Extremity Assessment Upper Extremity Assessment: Overall WFL for tasks assessed    Lower Extremity  Assessment Lower Extremity Assessment: Generalized weakness (R LE grossly 3/5; L LE grossly 4/5)       Communication   Communication: No difficulties  Cognition Arousal/Alertness: Awake/alert Behavior During Therapy: WFL for tasks assessed/performed Overall Cognitive Status: Within Functional Limits for tasks assessed                                          General Comments      Exercises Total Joint Exercises Goniometric ROM: R knee AAROM: 0-70 degrees Other Exercises Other Exercises: supine ther-ex performed on R LE including AP, QS, and SLRs. 10 reps with supervision   Assessment/Plan    PT Assessment Patient needs continued PT services  PT Problem List Decreased strength;Decreased range of motion;Decreased balance;Decreased mobility;Pain       PT Treatment Interventions DME instruction;Gait training;Therapeutic exercise;Stair training    PT Goals (Current goals can be found in the Care Plan section)  Acute Rehab PT Goals Patient Stated Goal: to go home PT Goal Formulation: With patient Time For Goal Achievement: 02/14/23 Potential to Achieve Goals: Good    Frequency BID     Co-evaluation               AM-PAC PT "6 Clicks" Mobility  Outcome Measure Help needed turning from your back to your side while in a flat bed without using bedrails?: A Little Help needed moving from lying on your back to sitting on the side of a flat bed without using bedrails?: A Little Help needed moving to and from a bed to a chair (including a wheelchair)?: A Little Help needed standing up from a chair using your arms (e.g., wheelchair or bedside chair)?: A Little Help needed to walk in hospital room?: A Little Help needed climbing 3-5 steps with a railing? : A Little 6 Click Score: 18    End of Session Equipment Utilized During Treatment: Gait belt Activity Tolerance: Patient tolerated treatment well Patient left: in chair;with chair alarm set Nurse  Communication: Mobility status PT Visit Diagnosis: Muscle weakness (generalized) (M62.81);Difficulty in walking, not elsewhere classified (R26.2);Pain Pain - Right/Left: Right Pain - part of body: Knee    Time: [id number] PT Time Calculation (min) (ACUTE ONLY): 30 min   Charges:   PT Evaluation $PT Eval Low Complexity: 1 Low PT Treatments $Gait Training: 8-22 mins        Greggory Stallion, PT, DPT, GCS [phone]   Lannah Koike 01/31/2023, 4:48 PM

## 2023-01-31 NOTE — TOC Progression Note (Signed)
Transition of Care [facility]) - Progression Note    Patient Details  Name: Brenda Landry MRN: [id number] Date of Birth: [birth year]  Transition [facility]) CM/SW [place], RN Phone Number: 01/31/2023, 2:52 PM  Clinical Narrative:    [place] notice given. Pt. Verbalizes understanding.  Daughter and Daughter in-law present at bedside.  Pt. Reports that prior to admission she lived at home with her grandson.  She reports that she was independent of ADL's prior to admission.  I spoke to patient about [facility] arrangements on discharge.  She has requested not to use [facility].  Surgeons office has pre-arranged patient with [facility].  Pt. Has requested to use another agency that in network with her insurance.  Will reach  to in-network agencies to see if an agency will be able to accept home health referral.  Daughter will transport patient at discharge.  Patient and daughter report no other discharge needs.   Will arranged for patient to have rolling walker and bedside commode for home use on discharge.         Expected Discharge Plan: [place] Barriers to Discharge: No Barriers Identified  Expected Discharge Plan and Services   Discharge Planning Services: CM Consult                               HH Arranged: PT, OT HH Agency: [place] Date [facility] Agency Contacted: 01/31/23 Time HH Agency Contacted: [id number]     Social Determinants of Health (SDOH) Interventions SDOH Screenings   Food Insecurity: No Food Insecurity (01/31/2023)  Housing: Low Risk  (01/31/2023)  Transportation Needs: No Transportation Needs (01/31/2023)  Utilities: Not At Risk (01/31/2023)  Tobacco Use: Low Risk  (01/31/2023)    Readmission Risk Interventions     No data to display

## 2023-01-31 NOTE — Discharge Instructions (Signed)
Instructions after Total Knee Replacement   Steffanie Rainwater M.D.     Dept. of [facility]  [address], [place]  [address]  Phone: [phone]   Fax: [phone]    DIET: Drink plenty of non-alcoholic fluids. Resume your normal diet. Include foods high in fiber.  ACTIVITY:  You may use crutches or a walker with weight-bearing as tolerated, unless instructed otherwise. You may be weaned off of the walker or crutches by your Physical Therapist.  Do NOT place pillows under the knee. Anything placed under the knee could limit your ability to straighten the knee.   Continue doing gentle exercises. Exercising will reduce the pain and swelling, increase motion, and prevent muscle weakness.   Please continue to use the TED compression stockings for 2 weeks. You may remove the stockings at night, but should reapply them in the morning. Do not drive or operate any equipment until instructed.  WOUND CARE:  Continue to use the PolarCare or ice packs periodically to reduce pain and swelling. You may begin showering 3 days after surgery with honeycomb dressing. Remove honeycomb dressing 7 days after surgery and continue showering. Allow dermabond to fall off on its own.  MEDICATIONS: You may resume your regular medications. Please take the pain medication as prescribed on the medication. Do not take pain medication on an empty stomach. You have been given a prescription for a blood thinner (Lovenox or Coumadin). Please take the medication as instructed. (NOTE: After completing a 2 week course of Lovenox, take one 81 mg Enteric-coated aspirin twice a day. This along with elevation will help reduce the possibility of phlebitis in your operated leg.) Do not drive or drink alcoholic beverages when taking pain medications.  POSTOPERATIVE CONSTIPATION PROTOCOL Constipation - defined medically as fewer than three stools per week and severe  constipation as less than one stool per week.  One of the most common issues patients have following surgery is constipation.  Even if you have a regular bowel pattern at home, your normal regimen is likely to be disrupted due to multiple reasons following surgery.  Combination of anesthesia, postoperative narcotics, change in appetite and fluid intake all can affect your bowels.  In order to avoid complications following surgery, here are some recommendations in order to help you during your recovery period.  Colace (docusate) - Pick up an over-the-counter form of Colace or another stool softener and take twice a day as long as you are requiring postoperative pain medications.  Take with a full glass of water daily.  If you experience loose stools or diarrhea, hold the colace until you stool forms back up.  If your symptoms do not get better within 1 week or if they get worse, check with your doctor.  Dulcolax (bisacodyl) - Pick up over-the-counter and take as directed by the product packaging as needed to assist with the movement of your bowels.  Take with a full glass of water.  Use this product as needed if not relieved by Colace only.   MiraLax (polyethylene glycol) - Pick up over-the-counter to have on hand.  MiraLax is a solution that will increase the amount of water in your bowels to assist with bowel movements.  Take as directed and can mix with a glass of water, juice, soda, coffee, or tea.  Take if you go more than two days without a movement. Do not use MiraLax more than once per day. Call your doctor if  you are still constipated or irregular after using this medication for 7 days in a row.  If you continue to have problems with postoperative constipation, please contact the office for further assistance and recommendations.  If you experience "the worst abdominal pain ever" or develop nausea or vomiting, please contact the office immediatly for further recommendations for  treatment.   CALL THE OFFICE FOR: Temperature above 101 degrees Excessive bleeding or drainage on the dressing. Excessive swelling, coldness, or paleness of the toes. Persistent nausea and vomiting.  FOLLOW-UP:  You should have an appointment to return to the office in 14 days after surgery. Arrangements have been made for continuation of Physical Therapy (either home therapy or outpatient therapy).

## 2023-01-31 NOTE — Plan of Care (Signed)
  Problem: Pain Management: Goal: Pain level will decrease with appropriate interventions Outcome: Progressing   Problem: Skin Integrity: Goal: Will show signs of wound healing Outcome: Progressing   Problem: Safety: Goal: Ability to remain free from injury will improve Outcome: Progressing

## 2023-01-31 NOTE — TOC Progression Note (Signed)
Transition of Care [facility]) - Progression Note    Patient Details  Name: Brenda Landry MRN: [id number] Date of Birth: [birth year]  Transition of Care [facility]) CM/SW [place], RN Phone Number: 01/31/2023, 4:05 PM  Clinical Narrative:   Damaris Schooner with Corene Cornea from  [facility], will accept patient for [facility] services.    Expected Discharge Plan: [place] Barriers to Discharge: No Barriers Identified  Expected Discharge Plan and Services   Discharge Planning Services: CM Consult                               HH Arranged: PT, OT HH Agency: [place] Date [facility] Agency Contacted: 01/31/23 Time HH Agency Contacted: [id number]     Social Determinants of Health (SDOH) Interventions SDOH Screenings   Food Insecurity: No Food Insecurity (01/31/2023)  Housing: Low Risk  (01/31/2023)  Transportation Needs: No Transportation Needs (01/31/2023)  Utilities: Not At Risk (01/31/2023)  Tobacco Use: Low Risk  (01/31/2023)    Readmission Risk Interventions     No data to display

## 2023-01-31 NOTE — Interval H&P Note (Signed)
Patient history and physical updated. Consent reviewed including risks, benefits, and alternatives to surgery. Patient agrees with above plan to proceed with right total knee arthroplasty

## 2023-01-31 NOTE — Discharge Summary (Signed)
Physician Discharge Summary  Patient ID: Brenda Landry MRN: [id number] DOB/AGE: Apr 16, 1941 82 y.o.  Admit date: 01/31/2023 Discharge date: 02/01/2023  Admission Diagnoses:  S/P TKR (total knee replacement) using cement, right [Z96.651]   Discharge Diagnoses: Patient Active Problem List   Diagnosis Date Noted   S/P TKR (total knee replacement) using cement, right 01/31/2023   Nausea without vomiting 08/14/2016   Hx of colonic polyps 04/17/2011   History of breast cancer in female 04/17/2011    Past Medical History:  Diagnosis Date   Breast cancer ([place]) 12/03/2005   lumpectomy, XRT, Arimadex   Colon polyps 12/04/2003   Depression    DM (diabetes mellitus) ([place])    HTN (hypertension)    Hyperlipidemia    Osteoarthritis    Osteopenia    Vitamin D deficiency      Transfusion: none   Consultants (if any):   Discharged Condition: Improved  Hospital Course: Brenda Landry is an 82 y.o. female who was admitted 01/31/2023 with a diagnosis of S/P TKR (total knee replacement) using cement, right and went to the operating room on 01/31/2023 and underwent the above named procedures.    Surgeries: Procedure(s): TOTAL KNEE ARTHROPLASTY on 01/31/2023 Patient tolerated the surgery well. Taken to PACU where she was stabilized and then transferred to the orthopedic floor.  Started on Lovenox 30 mg q 12 hrs. TEDs and SCDs applied bilaterally. Heels elevated on bed. No evidence of DVT. Negative [place]. Physical therapy started on day #1 for gait training and transfer. OT started day #1 for ADL and assisted devices. Patient's IV was d/c on day #1. Patient was able to safely and independently complete all PT goals. PT recommending discharge to home.  On post op day #1 patient was stable and ready for discharge to home with HHPT.  Implants: Femur: Persona Size 5 CR   Tibia: Persona Size D w14x38m stem extension  Poly: Size 132mMC  Patella: 29x8m9mymmetric    She was given perioperative  antibiotics:  Anti-infectives (From admission, onward)    Start     Dose/Rate Route Frequency Ordered Stop   01/31/23 1030  ceFAZolin (ANCEF) IVPB 2g/100 mL premix        2 g 200 mL/hr over 30 Minutes Intravenous Every 6 hours 01/31/23 1028 01/31/23 2040   01/31/23 0600  ceFAZolin (ANCEF) IVPB 2g/100 mL premix        2 g 200 mL/hr over 30 Minutes Intravenous On call to O.R. 01/30/23 2251 01/31/23 0818     .  She was given sequential compression devices, early ambulation, and Lovenox  for DVT prophylaxis.  She benefited maximally from the hospital stay and there were no complications.    Recent vital signs:  Vitals:   02/01/23 0348 02/01/23 0727  BP: (!) 124/50 (!) 130/55  Pulse: 72 69  Resp: 16 16  Temp: 98.1 F (36.7 C) 98.1 F (36.7 C)  SpO2: 96% 98%    Recent laboratory studies:  Lab Results  Component Value Date   HGB 9.7 (L) 02/01/2023   HGB 13.2 01/21/2023   HGB 13.8 04/10/2011   Lab Results  Component Value Date   WBC 12.5 (H) 02/01/2023   PLT 242 02/01/2023   No results found for: "INR" Lab Results  Component Value Date   NA 126 (L) 02/01/2023   K 3.7 02/01/2023   CL 94 (L) 02/01/2023   CO2 23 02/01/2023   BUN 18 02/01/2023   CREATININE 0.92 02/01/2023   GLUCOSE  111 (H) 02/01/2023    Discharge Medications:   Allergies as of 02/01/2023       Reactions   Codeine Itching        Medication List     STOP taking these medications    aspirin EC 81 MG tablet   ibuprofen 200 MG tablet Commonly known as: ADVIL       TAKE these medications    acetaminophen 500 MG tablet Commonly known as: TYLENOL Take 1,000 mg by mouth every 6 (six) hours as needed.   atorvastatin 40 MG tablet Commonly known as: LIPITOR Take 40 mg by mouth daily.   celecoxib 200 MG capsule Commonly known as: CeleBREX Take 1 capsule (200 mg total) by mouth 2 (two) times daily for 14 days.   docusate sodium 100 MG capsule Commonly known as: COLACE Take 1 capsule  (100 mg total) by mouth 2 (two) times daily.   enalapril 5 MG tablet Commonly known as: VASOTEC Take 5 mg by mouth daily.   enoxaparin 40 MG/0.4ML injection Commonly known as: LOVENOX Inject 0.4 mLs (40 mg total) into the skin daily for 14 days.   imipramine 10 MG tablet Commonly known as: TOFRANIL Take 10 mg by mouth at bedtime.   magnesium oxide 400 (240 Mg) MG tablet Commonly known as: MAG-OX Take 1 tablet by mouth 4 (four) times daily.   metFORMIN 500 MG tablet Commonly known as: GLUCOPHAGE Take 1,000 mg by mouth 2 (two) times daily with a meal.   omeprazole 40 MG capsule Commonly known as: PRILOSEC Take 40 mg by mouth daily.   ondansetron 4 MG tablet Commonly known as: ZOFRAN Take 1 tablet (4 mg total) by mouth every 6 (six) hours as needed for nausea.   traMADol 50 MG tablet Commonly known as: ULTRAM Take 1 tablet (50 mg total) by mouth every 6 (six) hours as needed for moderate pain.   traZODone 50 MG tablet Commonly known as: DESYREL Take 50 mg by mouth at bedtime.   triamterene-hydrochlorothiazide 37.5-25 MG capsule Commonly known as: DYAZIDE Take 1 capsule by mouth daily.   valsartan 320 MG tablet Commonly known as: DIOVAN Take 320 mg by mouth daily.               Durable Medical Equipment  (From admission, onward)           Start     Ordered   01/31/23 1450  For home use only DME Bedside commode  Once       Question:  Patient needs a bedside commode to treat with the following condition  Answer:  Impaired mobility   01/31/23 1449   01/31/23 1449  For home use only DME Walker rolling  Once       Question Answer Comment  Walker: With 5 Inch Wheels   Patient needs a walker to treat with the following condition Impaired mobility      01/31/23 1449            Diagnostic Studies: DG Knee Right Port  Result Date: 01/31/2023 CLINICAL DATA:  Status post right knee replacement EXAM: PORTABLE RIGHT KNEE - 1-2 VIEW COMPARISON:  None  Available. FINDINGS: The patient is status post right knee arthroplasty. Hardware alignment is within expected limits, without evidence of complication. There is expected surrounding postoperative soft tissue swelling. IMPRESSION: Status post right knee arthroplasty without evidence of complication. Electronically Signed   By: Valetta Mole M.D.   On: 01/31/2023 10:21    Disposition: Discharge  disposition: 06-Home-Health Care Svc          Follow-up Information     Duanne Guess, PA-C Follow up in 2 week(s).   Specialties: Orthopedic Surgery, Emergency Medicine Contact information: [place] [address] [phone]                  Signed: Feliberto Gottron 02/01/2023, 8:37 AM

## 2023-02-01 DIAGNOSIS — Z7982 Long term (current) use of aspirin: Secondary | ICD-10-CM | POA: Diagnosis not present

## 2023-02-01 DIAGNOSIS — Z853 Personal history of malignant neoplasm of breast: Secondary | ICD-10-CM | POA: Diagnosis not present

## 2023-02-01 DIAGNOSIS — Z7984 Long term (current) use of oral hypoglycemic drugs: Secondary | ICD-10-CM | POA: Diagnosis not present

## 2023-02-01 DIAGNOSIS — I10 Essential (primary) hypertension: Secondary | ICD-10-CM | POA: Diagnosis not present

## 2023-02-01 DIAGNOSIS — Z79899 Other long term (current) drug therapy: Secondary | ICD-10-CM | POA: Diagnosis not present

## 2023-02-01 DIAGNOSIS — E119 Type 2 diabetes mellitus without complications: Secondary | ICD-10-CM | POA: Diagnosis not present

## 2023-02-01 DIAGNOSIS — M1711 Unilateral primary osteoarthritis, right knee: Secondary | ICD-10-CM | POA: Diagnosis not present

## 2023-02-01 LAB — BASIC METABOLIC PANEL
Anion gap: 9 (ref 5–15)
BUN: 18 mg/dL (ref 8–23)
CO2: 23 mmol/L (ref 22–32)
Calcium: 8.4 mg/dL — ABNORMAL LOW (ref 8.9–10.3)
Chloride: 94 mmol/L — ABNORMAL LOW (ref 98–111)
Creatinine, Ser: 0.92 mg/dL (ref 0.44–1.00)
GFR, Estimated: >60 mL/min (ref >60)
Glucose, Bld: 111 mg/dL — ABNORMAL HIGH (ref 70–99)
Potassium: 3.7 mmol/L (ref 3.5–5.1)
Sodium: 126 mmol/L — ABNORMAL LOW (ref 135–145)

## 2023-02-01 LAB — CBC
HCT: 28.1 % — ABNORMAL LOW (ref 36.0–46.0)
Hemoglobin: 9.7 g/dL — ABNORMAL LOW (ref 12.0–15.0)
MCH: 29.8 pg (ref 26.0–34.0)
MCHC: 34.5 g/dL (ref 30.0–36.0)
MCV: 86.2 fL (ref 80.0–100.0)
Platelets: 242 10*3/uL (ref 150–400)
RBC: 3.26 MIL/uL — ABNORMAL LOW (ref 3.87–5.11)
RDW: 12.6 % (ref 11.5–15.5)
WBC: 12.5 10*3/uL — ABNORMAL HIGH (ref 4.0–10.5)
nRBC: 0 % (ref 0.0–0.2)

## 2023-02-01 LAB — GLUCOSE, CAPILLARY: Glucose-Capillary: 97 mg/dL (ref 70–99)

## 2023-02-01 MED ORDER — CELECOXIB 200 MG PO CAPS: 200.0000 mg | ORAL_CAPSULE | Freq: Two times a day (BID) | ORAL | 0 refills | Status: AC

## 2023-02-01 MED ORDER — TRAMADOL HCL 50 MG PO TABS: 50.0000 mg | ORAL_TABLET | Freq: Four times a day (QID) | ORAL | 0 refills | Status: DC | PRN

## 2023-02-01 MED ORDER — DOCUSATE SODIUM 100 MG PO CAPS: 100.0000 mg | ORAL_CAPSULE | Freq: Two times a day (BID) | ORAL | 0 refills | Status: DC

## 2023-02-01 MED ORDER — ONDANSETRON HCL 4 MG PO TABS: 4.0000 mg | ORAL_TABLET | Freq: Four times a day (QID) | ORAL | 0 refills | Status: DC | PRN

## 2023-02-01 MED ORDER — ENOXAPARIN SODIUM 40 MG/0.4ML IJ SOSY: 40.0000 mg | PREFILLED_SYRINGE | INTRAMUSCULAR | 0 refills | Status: DC

## 2023-02-01 MED ORDER — GLUCERNA SHAKE PO LIQD
237.0000 mL | Freq: Three times a day (TID) | ORAL | Status: DC
  Administered 2023-02-01: 237 mL via ORAL

## 2023-02-01 NOTE — Progress Notes (Signed)
   Subjective: 1 Day Post-Op Procedure(s) (LRB): TOTAL KNEE ARTHROPLASTY (Right) Patient reports pain as mild.   Patient is well, and has had no acute complaints or problems Denies any CP, SOB, ABD pain. We will continue therapy today.  Plan is to go Home after hospital stay.  Objective: Vital signs in last 24 hours: Temp:  [97.5 F (36.4 C)-98.3 F (36.8 C)] 98.1 F (36.7 C) (03/01 0727) Pulse Rate:  [67-79] 69 (03/01 0727) Resp:  [14-30] 16 (03/01 0727) BP: (104-134)/(48-87) 130/55 (03/01 0727) SpO2:  [93 %-100 %] 98 % (03/01 0727)  Intake/Output from previous day: 02/29 0701 - 03/01 0700 In: 2203.5 [I.V.:2000; IV Piggyback:203.5] Out: 575 [Urine:500; Blood:75] Intake/Output this shift: No intake/output data recorded.  Recent Labs    02/01/23 0341  HGB 9.7*   Recent Labs    02/01/23 0341  WBC 12.5*  RBC 3.26*  HCT 28.1*  PLT 242   Recent Labs    02/01/23 0341  NA 126*  K 3.7  CL 94*  CO2 23  BUN 18  CREATININE 0.92  GLUCOSE 111*  CALCIUM 8.4*   No results for input(s): "LABPT", "INR" in the last 72 hours.  EXAM General - Patient is Alert, Appropriate, and Oriented Extremity - Neurovascular intact Sensation intact distally Intact pulses distally Dorsiflexion/Plantar flexion intact No cellulitis present Compartment soft Dressing - dressing C/D/I and no drainage Motor Function - intact, moving foot and toes well on exam.   Past Medical History:  Diagnosis Date   Breast cancer ([place]) 12/03/2005   lumpectomy, XRT, Arimadex   Colon polyps 12/04/2003   Depression    DM (diabetes mellitus) ([place])    HTN (hypertension)    Hyperlipidemia    Osteoarthritis    Osteopenia    Vitamin D deficiency     Assessment/Plan:   1 Day Post-Op Procedure(s) (LRB): TOTAL KNEE ARTHROPLASTY (Right) Principal Problem:   S/P TKR (total knee replacement) using cement, right  Estimated body mass index is 22.88 kg/m as calculated from the following:   Height as  of this encounter: '5\' 3"'$  (1.6 m).   Weight as of this encounter: 58.6 kg. Advance diet Up with therapy Pain well-controlled Labs and vital signs are stable Care management to assist with discharge to home with home health PT today.  Patient has completed all PT goals this morning.  DVT Prophylaxis - Lovenox, TED hose, and SCDs Weight-Bearing as tolerated to right leg   T. Brenda Hora, PA-C [place] 02/01/2023, 8:34 AM

## 2023-02-01 NOTE — Plan of Care (Signed)
  Problem: Education: Goal: Knowledge of General Education information will improve Description: Including pain rating scale, medication(s)/side effects and non-pharmacologic comfort measures Outcome: Progressing   Problem: Health Behavior/Discharge Planning: Goal: Ability to manage health-related needs will improve Outcome: Progressing   Problem: Clinical Measurements: Goal: Ability to maintain clinical measurements within normal limits will improve Outcome: Progressing Goal: Diagnostic test results will improve Outcome: Progressing Goal: Cardiovascular complication will be avoided Outcome: Progressing   Problem: Activity: Goal: Risk for activity intolerance will decrease Outcome: Progressing   Problem: Nutrition: Goal: Adequate nutrition will be maintained Outcome: Progressing   Problem: Elimination: Goal: Will not experience complications related to bowel motility Outcome: Progressing Goal: Will not experience complications related to urinary retention Outcome: Progressing   Problem: Pain Managment: Goal: General experience of comfort will improve Outcome: Progressing

## 2023-02-01 NOTE — TOC Progression Note (Signed)
Transition of Care [facility]) - Progression Note    Patient Details  Name: Brenda Landry MRN: [id number] Date of Birth: 01-19-1941  Transition of Care [facility]) CM/SW [place], RN Phone Number: 02/01/2023, 8:54 AM  Clinical Narrative:     Dme to be delivered by [employer], [facility] is set up for [facility]  Expected Discharge Plan: [place] Barriers to Discharge: No Barriers Identified  Expected Discharge Plan and Services   Discharge Planning Services: CM Consult     Expected Discharge Date: 02/01/23                         HH Arranged: PT, OT HH Agency: [place] Date [facility] Agency Contacted: 01/31/23 Time HH Agency Contacted: [id number]     Social Determinants of Health (SDOH) Interventions SDOH Screenings   Food Insecurity: No Food Insecurity (01/31/2023)  Housing: Low Risk  (01/31/2023)  Transportation Needs: No Transportation Needs (01/31/2023)  Utilities: Not At Risk (01/31/2023)  Tobacco Use: Low Risk  (01/31/2023)    Readmission Risk Interventions     No data to display

## 2023-02-01 NOTE — Progress Notes (Signed)
Physical Therapy Treatment Patient Details Name: Brenda Landry MRN: [id number] DOB: [birth year] Today's Date: 02/01/2023   History of Present Illness Pt admitted for R TKR. HIstory of DM, breast ca, HLD, HTN and is POD 0 at time of evaluation.    PT Comments    Pt was long sitting in bed with supportive daughter at bedside." I'm wanting to go home." Pt endorses 2/10 pain but was agreeable to session. Easily and safely able to exit bed, stand to RW, and ambulate > 200 ft. She safely demonstrated proper stair performance. Is cleared from an acute PT standpoint for safe DC home with HHPT to follow. Pt was issued HEP with instructions. AROM 2-92 degrees after AAROM exercises. Pt will need RW + BSC for DC.    Recommendations for follow up therapy are one component of a multi-disciplinary discharge planning process, led by the attending physician.  Recommendations may be updated based on patient status, additional functional criteria and insurance authorization.  Follow Up Recommendations  Home health PT     Assistance Recommended at Discharge Set up Supervision/Assistance  Patient can return home with the following A little help with walking and/or transfers;A little help with bathing/dressing/bathroom;Help with stairs or ramp for entrance   Equipment Recommendations  Rolling walker (2 wheels);BSC/3in1       Precautions / Restrictions Precautions Precautions: Fall;Knee Precaution Booklet Issued: Yes (comment) Restrictions Weight Bearing Restrictions: Yes RLE Weight Bearing: Weight bearing as tolerated     Mobility  Bed Mobility Overal bed mobility: Modified Independent Bed Mobility: Supine to Sit  Supine to sit: Modified independent (Device/Increase time)     Transfers Overall transfer level: Needs assistance Equipment used: Rolling walker (2 wheels) Transfers: Sit to/from Stand Sit to Stand: Supervision     Ambulation/Gait Ambulation/Gait assistance: Supervision Gait  Distance (Feet): 200 Feet Assistive device: Rolling walker (2 wheels) Gait Pattern/deviations: Step-through pattern Gait velocity: WNL  General Gait Details: no LOB or safety concerns with gait using RW. Author adjusted RW to lowest height to promote upright posture.   Stairs Stairs: Yes Stairs assistance: Min guard, Supervision Stair Management: No rails, Step to pattern, Forwards Number of Stairs: 4 General stair comments: Pt performed 1 step to simulate home entry thyen performed stairs with +1 UE support. no difficulty or safety concerns during stair performance    Balance Overall balance assessment: Needs assistance Sitting-balance support: Feet supported Sitting balance-Leahy Scale: Good     Standing balance support: Bilateral upper extremity supported Standing balance-Leahy Scale: Good       Cognition Arousal/Alertness: Awake/alert Behavior During Therapy: WFL for tasks assessed/performed Overall Cognitive Status: Within Functional Limits for tasks assessed         Exercises Total Joint Exercises Goniometric ROM: R knee ~AROM 2-92    General Comments General comments (skin integrity, edema, etc.): Pt was educated on car transfers, HEP, importance of icing + proper positioning and routine mobility.      Pertinent Vitals/Pain Pain Assessment Pain Assessment: 0-10 Pain Score: 2  Pain Location: R knee Pain Descriptors / Indicators: Operative site guarding Pain Intervention(s): Limited activity within patient's tolerance, Monitored during session, Repositioned, Premedicated before session, Ice applied     PT Goals (current goals can now be found in the care plan section) Acute Rehab PT Goals Patient Stated Goal: to go home Progress towards PT goals: Progressing toward goals    Frequency    BID      PT Plan Current plan remains appropriate  AM-PAC PT "6 Clicks" Mobility   Outcome Measure  Help needed turning from your back to your side while  in a flat bed without using bedrails?: A Little Help needed moving from lying on your back to sitting on the side of a flat bed without using bedrails?: A Little Help needed moving to and from a bed to a chair (including a wheelchair)?: A Little Help needed standing up from a chair using your arms (e.g., wheelchair or bedside chair)?: A Little Help needed to walk in hospital room?: A Little Help needed climbing 3-5 steps with a railing? : A Little 6 Click Score: 18    End of Session Equipment Utilized During Treatment: Gait belt Activity Tolerance: Patient tolerated treatment well Patient left: in chair;with chair alarm set;with family/visitor present;with nursing/sitter in room;with call bell/phone within reach Nurse Communication: Mobility status PT Visit Diagnosis: Muscle weakness (generalized) (M62.81);Difficulty in walking, not elsewhere classified (R26.2);Pain Pain - Right/Left: Right Pain - part of body: Knee     Time: [id number] PT Time Calculation (min) (ACUTE ONLY): 13 min  Charges:  $Gait Training: 8-22 mins                    Julaine Fusi PTA 02/01/23, 8:49 AM

## 2023-02-02 DIAGNOSIS — E559 Vitamin D deficiency, unspecified: Secondary | ICD-10-CM | POA: Diagnosis not present

## 2023-02-02 DIAGNOSIS — M8580 Other specified disorders of bone density and structure, unspecified site: Secondary | ICD-10-CM | POA: Diagnosis not present

## 2023-02-02 DIAGNOSIS — F32A Depression, unspecified: Secondary | ICD-10-CM | POA: Diagnosis not present

## 2023-02-02 DIAGNOSIS — E785 Hyperlipidemia, unspecified: Secondary | ICD-10-CM | POA: Diagnosis not present

## 2023-02-02 DIAGNOSIS — I10 Essential (primary) hypertension: Secondary | ICD-10-CM | POA: Diagnosis not present

## 2023-02-02 DIAGNOSIS — M810 Age-related osteoporosis without current pathological fracture: Secondary | ICD-10-CM | POA: Diagnosis not present

## 2023-02-02 DIAGNOSIS — E119 Type 2 diabetes mellitus without complications: Secondary | ICD-10-CM | POA: Diagnosis not present

## 2023-02-02 DIAGNOSIS — Z853 Personal history of malignant neoplasm of breast: Secondary | ICD-10-CM | POA: Diagnosis not present

## 2023-02-02 DIAGNOSIS — Z471 Aftercare following joint replacement surgery: Secondary | ICD-10-CM | POA: Diagnosis not present

## 2023-02-05 DIAGNOSIS — Z853 Personal history of malignant neoplasm of breast: Secondary | ICD-10-CM | POA: Diagnosis not present

## 2023-02-05 DIAGNOSIS — M810 Age-related osteoporosis without current pathological fracture: Secondary | ICD-10-CM | POA: Diagnosis not present

## 2023-02-05 DIAGNOSIS — M8580 Other specified disorders of bone density and structure, unspecified site: Secondary | ICD-10-CM | POA: Diagnosis not present

## 2023-02-05 DIAGNOSIS — E559 Vitamin D deficiency, unspecified: Secondary | ICD-10-CM | POA: Diagnosis not present

## 2023-02-05 DIAGNOSIS — F32A Depression, unspecified: Secondary | ICD-10-CM | POA: Diagnosis not present

## 2023-02-05 DIAGNOSIS — Z471 Aftercare following joint replacement surgery: Secondary | ICD-10-CM | POA: Diagnosis not present

## 2023-02-05 DIAGNOSIS — E785 Hyperlipidemia, unspecified: Secondary | ICD-10-CM | POA: Diagnosis not present

## 2023-02-05 DIAGNOSIS — E119 Type 2 diabetes mellitus without complications: Secondary | ICD-10-CM | POA: Diagnosis not present

## 2023-02-05 DIAGNOSIS — I10 Essential (primary) hypertension: Secondary | ICD-10-CM | POA: Diagnosis not present

## 2023-02-07 DIAGNOSIS — F32A Depression, unspecified: Secondary | ICD-10-CM | POA: Diagnosis not present

## 2023-02-07 DIAGNOSIS — E785 Hyperlipidemia, unspecified: Secondary | ICD-10-CM | POA: Diagnosis not present

## 2023-02-07 DIAGNOSIS — M810 Age-related osteoporosis without current pathological fracture: Secondary | ICD-10-CM | POA: Diagnosis not present

## 2023-02-07 DIAGNOSIS — M8580 Other specified disorders of bone density and structure, unspecified site: Secondary | ICD-10-CM | POA: Diagnosis not present

## 2023-02-07 DIAGNOSIS — E119 Type 2 diabetes mellitus without complications: Secondary | ICD-10-CM | POA: Diagnosis not present

## 2023-02-07 DIAGNOSIS — Z853 Personal history of malignant neoplasm of breast: Secondary | ICD-10-CM | POA: Diagnosis not present

## 2023-02-07 DIAGNOSIS — Z471 Aftercare following joint replacement surgery: Secondary | ICD-10-CM | POA: Diagnosis not present

## 2023-02-07 DIAGNOSIS — I10 Essential (primary) hypertension: Secondary | ICD-10-CM | POA: Diagnosis not present

## 2023-02-07 DIAGNOSIS — E559 Vitamin D deficiency, unspecified: Secondary | ICD-10-CM | POA: Diagnosis not present

## 2023-02-11 DIAGNOSIS — Z471 Aftercare following joint replacement surgery: Secondary | ICD-10-CM | POA: Diagnosis not present

## 2023-02-11 DIAGNOSIS — Z853 Personal history of malignant neoplasm of breast: Secondary | ICD-10-CM | POA: Diagnosis not present

## 2023-02-11 DIAGNOSIS — E785 Hyperlipidemia, unspecified: Secondary | ICD-10-CM | POA: Diagnosis not present

## 2023-02-11 DIAGNOSIS — F32A Depression, unspecified: Secondary | ICD-10-CM | POA: Diagnosis not present

## 2023-02-11 DIAGNOSIS — E119 Type 2 diabetes mellitus without complications: Secondary | ICD-10-CM | POA: Diagnosis not present

## 2023-02-11 DIAGNOSIS — E559 Vitamin D deficiency, unspecified: Secondary | ICD-10-CM | POA: Diagnosis not present

## 2023-02-11 DIAGNOSIS — M8580 Other specified disorders of bone density and structure, unspecified site: Secondary | ICD-10-CM | POA: Diagnosis not present

## 2023-02-11 DIAGNOSIS — M810 Age-related osteoporosis without current pathological fracture: Secondary | ICD-10-CM | POA: Diagnosis not present

## 2023-02-11 DIAGNOSIS — I10 Essential (primary) hypertension: Secondary | ICD-10-CM | POA: Diagnosis not present

## 2023-02-13 DIAGNOSIS — F32A Depression, unspecified: Secondary | ICD-10-CM | POA: Diagnosis not present

## 2023-02-13 DIAGNOSIS — E559 Vitamin D deficiency, unspecified: Secondary | ICD-10-CM | POA: Diagnosis not present

## 2023-02-13 DIAGNOSIS — Z853 Personal history of malignant neoplasm of breast: Secondary | ICD-10-CM | POA: Diagnosis not present

## 2023-02-13 DIAGNOSIS — I10 Essential (primary) hypertension: Secondary | ICD-10-CM | POA: Diagnosis not present

## 2023-02-13 DIAGNOSIS — Z471 Aftercare following joint replacement surgery: Secondary | ICD-10-CM | POA: Diagnosis not present

## 2023-02-13 DIAGNOSIS — E785 Hyperlipidemia, unspecified: Secondary | ICD-10-CM | POA: Diagnosis not present

## 2023-02-13 DIAGNOSIS — M8580 Other specified disorders of bone density and structure, unspecified site: Secondary | ICD-10-CM | POA: Diagnosis not present

## 2023-02-13 DIAGNOSIS — M810 Age-related osteoporosis without current pathological fracture: Secondary | ICD-10-CM | POA: Diagnosis not present

## 2023-02-13 DIAGNOSIS — E119 Type 2 diabetes mellitus without complications: Secondary | ICD-10-CM | POA: Diagnosis not present

## 2023-02-21 DIAGNOSIS — E559 Vitamin D deficiency, unspecified: Secondary | ICD-10-CM | POA: Diagnosis not present

## 2023-02-21 DIAGNOSIS — Z471 Aftercare following joint replacement surgery: Secondary | ICD-10-CM | POA: Diagnosis not present

## 2023-02-21 DIAGNOSIS — M8580 Other specified disorders of bone density and structure, unspecified site: Secondary | ICD-10-CM | POA: Diagnosis not present

## 2023-02-21 DIAGNOSIS — F32A Depression, unspecified: Secondary | ICD-10-CM | POA: Diagnosis not present

## 2023-02-21 DIAGNOSIS — E119 Type 2 diabetes mellitus without complications: Secondary | ICD-10-CM | POA: Diagnosis not present

## 2023-02-21 DIAGNOSIS — I10 Essential (primary) hypertension: Secondary | ICD-10-CM | POA: Diagnosis not present

## 2023-02-21 DIAGNOSIS — M810 Age-related osteoporosis without current pathological fracture: Secondary | ICD-10-CM | POA: Diagnosis not present

## 2023-02-21 DIAGNOSIS — E785 Hyperlipidemia, unspecified: Secondary | ICD-10-CM | POA: Diagnosis not present

## 2023-02-21 DIAGNOSIS — Z853 Personal history of malignant neoplasm of breast: Secondary | ICD-10-CM | POA: Diagnosis not present

## 2023-02-27 DIAGNOSIS — Z853 Personal history of malignant neoplasm of breast: Secondary | ICD-10-CM | POA: Diagnosis not present

## 2023-02-27 DIAGNOSIS — Z471 Aftercare following joint replacement surgery: Secondary | ICD-10-CM | POA: Diagnosis not present

## 2023-02-27 DIAGNOSIS — E785 Hyperlipidemia, unspecified: Secondary | ICD-10-CM | POA: Diagnosis not present

## 2023-02-27 DIAGNOSIS — E119 Type 2 diabetes mellitus without complications: Secondary | ICD-10-CM | POA: Diagnosis not present

## 2023-02-27 DIAGNOSIS — M8580 Other specified disorders of bone density and structure, unspecified site: Secondary | ICD-10-CM | POA: Diagnosis not present

## 2023-02-27 DIAGNOSIS — E559 Vitamin D deficiency, unspecified: Secondary | ICD-10-CM | POA: Diagnosis not present

## 2023-02-27 DIAGNOSIS — F32A Depression, unspecified: Secondary | ICD-10-CM | POA: Diagnosis not present

## 2023-02-27 DIAGNOSIS — M810 Age-related osteoporosis without current pathological fracture: Secondary | ICD-10-CM | POA: Diagnosis not present

## 2023-02-27 DIAGNOSIS — I10 Essential (primary) hypertension: Secondary | ICD-10-CM | POA: Diagnosis not present

## 2023-03-08 DIAGNOSIS — M810 Age-related osteoporosis without current pathological fracture: Secondary | ICD-10-CM | POA: Diagnosis not present

## 2023-03-08 DIAGNOSIS — E119 Type 2 diabetes mellitus without complications: Secondary | ICD-10-CM | POA: Diagnosis not present

## 2023-03-08 DIAGNOSIS — F32A Depression, unspecified: Secondary | ICD-10-CM | POA: Diagnosis not present

## 2023-03-08 DIAGNOSIS — E559 Vitamin D deficiency, unspecified: Secondary | ICD-10-CM | POA: Diagnosis not present

## 2023-03-08 DIAGNOSIS — Z471 Aftercare following joint replacement surgery: Secondary | ICD-10-CM | POA: Diagnosis not present

## 2023-03-08 DIAGNOSIS — M8580 Other specified disorders of bone density and structure, unspecified site: Secondary | ICD-10-CM | POA: Diagnosis not present

## 2023-03-08 DIAGNOSIS — Z853 Personal history of malignant neoplasm of breast: Secondary | ICD-10-CM | POA: Diagnosis not present

## 2023-03-08 DIAGNOSIS — I10 Essential (primary) hypertension: Secondary | ICD-10-CM | POA: Diagnosis not present

## 2023-03-08 DIAGNOSIS — E785 Hyperlipidemia, unspecified: Secondary | ICD-10-CM | POA: Diagnosis not present

## 2023-03-12 DIAGNOSIS — M810 Age-related osteoporosis without current pathological fracture: Secondary | ICD-10-CM | POA: Diagnosis not present

## 2023-03-12 DIAGNOSIS — E559 Vitamin D deficiency, unspecified: Secondary | ICD-10-CM | POA: Diagnosis not present

## 2023-03-12 DIAGNOSIS — I10 Essential (primary) hypertension: Secondary | ICD-10-CM | POA: Diagnosis not present

## 2023-03-12 DIAGNOSIS — Z471 Aftercare following joint replacement surgery: Secondary | ICD-10-CM | POA: Diagnosis not present

## 2023-03-12 DIAGNOSIS — E785 Hyperlipidemia, unspecified: Secondary | ICD-10-CM | POA: Diagnosis not present

## 2023-03-12 DIAGNOSIS — M8580 Other specified disorders of bone density and structure, unspecified site: Secondary | ICD-10-CM | POA: Diagnosis not present

## 2023-03-12 DIAGNOSIS — Z853 Personal history of malignant neoplasm of breast: Secondary | ICD-10-CM | POA: Diagnosis not present

## 2023-03-12 DIAGNOSIS — E119 Type 2 diabetes mellitus without complications: Secondary | ICD-10-CM | POA: Diagnosis not present

## 2023-03-12 DIAGNOSIS — F32A Depression, unspecified: Secondary | ICD-10-CM | POA: Diagnosis not present

## 2023-03-15 DIAGNOSIS — Z96651 Presence of right artificial knee joint: Secondary | ICD-10-CM | POA: Diagnosis not present

## 2023-03-18 DIAGNOSIS — Z96651 Presence of right artificial knee joint: Secondary | ICD-10-CM | POA: Diagnosis not present

## 2023-03-18 DIAGNOSIS — M6281 Muscle weakness (generalized): Secondary | ICD-10-CM | POA: Diagnosis not present

## 2023-03-18 DIAGNOSIS — R2689 Other abnormalities of gait and mobility: Secondary | ICD-10-CM | POA: Diagnosis not present

## 2023-03-18 DIAGNOSIS — M25561 Pain in right knee: Secondary | ICD-10-CM | POA: Diagnosis not present

## 2023-03-21 DIAGNOSIS — M25561 Pain in right knee: Secondary | ICD-10-CM | POA: Diagnosis not present

## 2023-03-21 DIAGNOSIS — Z96651 Presence of right artificial knee joint: Secondary | ICD-10-CM | POA: Diagnosis not present

## 2023-03-21 DIAGNOSIS — M6281 Muscle weakness (generalized): Secondary | ICD-10-CM | POA: Diagnosis not present

## 2023-03-21 DIAGNOSIS — R2689 Other abnormalities of gait and mobility: Secondary | ICD-10-CM | POA: Diagnosis not present

## 2023-03-25 DIAGNOSIS — M6281 Muscle weakness (generalized): Secondary | ICD-10-CM | POA: Diagnosis not present

## 2023-03-25 DIAGNOSIS — M25561 Pain in right knee: Secondary | ICD-10-CM | POA: Diagnosis not present

## 2023-03-25 DIAGNOSIS — R2689 Other abnormalities of gait and mobility: Secondary | ICD-10-CM | POA: Diagnosis not present

## 2023-03-25 DIAGNOSIS — Z96651 Presence of right artificial knee joint: Secondary | ICD-10-CM | POA: Diagnosis not present

## 2023-03-28 DIAGNOSIS — M6281 Muscle weakness (generalized): Secondary | ICD-10-CM | POA: Diagnosis not present

## 2023-03-28 DIAGNOSIS — Z96651 Presence of right artificial knee joint: Secondary | ICD-10-CM | POA: Diagnosis not present

## 2023-03-28 DIAGNOSIS — M25561 Pain in right knee: Secondary | ICD-10-CM | POA: Diagnosis not present

## 2023-03-28 DIAGNOSIS — R2689 Other abnormalities of gait and mobility: Secondary | ICD-10-CM | POA: Diagnosis not present

## 2023-04-02 DIAGNOSIS — M25561 Pain in right knee: Secondary | ICD-10-CM | POA: Diagnosis not present

## 2023-04-02 DIAGNOSIS — Z96651 Presence of right artificial knee joint: Secondary | ICD-10-CM | POA: Diagnosis not present

## 2023-04-02 DIAGNOSIS — M6281 Muscle weakness (generalized): Secondary | ICD-10-CM | POA: Diagnosis not present

## 2023-04-02 DIAGNOSIS — R2689 Other abnormalities of gait and mobility: Secondary | ICD-10-CM | POA: Diagnosis not present

## 2023-04-04 DIAGNOSIS — R2689 Other abnormalities of gait and mobility: Secondary | ICD-10-CM | POA: Diagnosis not present

## 2023-04-04 DIAGNOSIS — M6281 Muscle weakness (generalized): Secondary | ICD-10-CM | POA: Diagnosis not present

## 2023-04-04 DIAGNOSIS — M25561 Pain in right knee: Secondary | ICD-10-CM | POA: Diagnosis not present

## 2023-04-04 DIAGNOSIS — Z96651 Presence of right artificial knee joint: Secondary | ICD-10-CM | POA: Diagnosis not present

## 2023-04-08 DIAGNOSIS — Z0000 Encounter for general adult medical examination without abnormal findings: Secondary | ICD-10-CM | POA: Diagnosis not present

## 2023-04-08 DIAGNOSIS — Z79899 Other long term (current) drug therapy: Secondary | ICD-10-CM | POA: Diagnosis not present

## 2023-04-08 DIAGNOSIS — E785 Hyperlipidemia, unspecified: Secondary | ICD-10-CM | POA: Diagnosis not present

## 2023-04-08 DIAGNOSIS — N390 Urinary tract infection, site not specified: Secondary | ICD-10-CM | POA: Diagnosis not present

## 2023-04-08 DIAGNOSIS — I10 Essential (primary) hypertension: Secondary | ICD-10-CM | POA: Diagnosis not present

## 2023-04-08 DIAGNOSIS — E119 Type 2 diabetes mellitus without complications: Secondary | ICD-10-CM | POA: Diagnosis not present

## 2023-04-08 DIAGNOSIS — E118 Type 2 diabetes mellitus with unspecified complications: Secondary | ICD-10-CM | POA: Diagnosis not present

## 2023-04-08 DIAGNOSIS — A499 Bacterial infection, unspecified: Secondary | ICD-10-CM | POA: Diagnosis not present

## 2023-05-17 DIAGNOSIS — Z96651 Presence of right artificial knee joint: Secondary | ICD-10-CM | POA: Diagnosis not present

## 2023-07-19 ENCOUNTER — Other Ambulatory Visit: Payer: Self-pay | Admitting: Internal Medicine

## 2023-07-19 DIAGNOSIS — Z1231 Encounter for screening mammogram for malignant neoplasm of breast: Secondary | ICD-10-CM | POA: Diagnosis not present

## 2023-07-19 DIAGNOSIS — E782 Mixed hyperlipidemia: Secondary | ICD-10-CM | POA: Diagnosis not present

## 2023-07-19 DIAGNOSIS — E118 Type 2 diabetes mellitus with unspecified complications: Secondary | ICD-10-CM | POA: Diagnosis not present

## 2023-07-19 DIAGNOSIS — Z79899 Other long term (current) drug therapy: Secondary | ICD-10-CM | POA: Diagnosis not present

## 2023-08-01 ENCOUNTER — Encounter (HOSPITAL_COMMUNITY): Payer: Self-pay

## 2023-08-01 ENCOUNTER — Ambulatory Visit (HOSPITAL_COMMUNITY)
Admission: RE | Admit: 2023-08-01 | Discharge: 2023-08-01 | Disposition: A | Discharge status: Home / Self Care | Payer: Medicare HMO | Source: Ambulatory Visit | Attending: Internal Medicine | Admitting: Internal Medicine

## 2023-08-01 DIAGNOSIS — Z1231 Encounter for screening mammogram for malignant neoplasm of breast: Secondary | ICD-10-CM | POA: Diagnosis not present

## 2023-08-20 DIAGNOSIS — D485 Neoplasm of uncertain behavior of skin: Secondary | ICD-10-CM | POA: Diagnosis not present

## 2023-08-20 DIAGNOSIS — Z85828 Personal history of other malignant neoplasm of skin: Secondary | ICD-10-CM | POA: Diagnosis not present

## 2023-08-20 DIAGNOSIS — L578 Other skin changes due to chronic exposure to nonionizing radiation: Secondary | ICD-10-CM | POA: Diagnosis not present

## 2023-08-20 DIAGNOSIS — C44612 Basal cell carcinoma of skin of right upper limb, including shoulder: Secondary | ICD-10-CM | POA: Diagnosis not present

## 2023-08-20 DIAGNOSIS — Z08 Encounter for follow-up examination after completed treatment for malignant neoplasm: Secondary | ICD-10-CM | POA: Diagnosis not present

## 2023-08-31 ENCOUNTER — Emergency Department: Payer: Medicare HMO

## 2023-08-31 ENCOUNTER — Observation Stay
Admission: EM | Admit: 2023-08-31 | Discharge: 2023-09-01 | Disposition: A | Discharge status: Home / Self Care | Payer: Medicare HMO | Attending: Hospitalist | Admitting: Hospitalist

## 2023-08-31 ENCOUNTER — Other Ambulatory Visit: Payer: Self-pay

## 2023-08-31 DIAGNOSIS — W19XXXA Unspecified fall, initial encounter: Secondary | ICD-10-CM | POA: Insufficient documentation

## 2023-08-31 DIAGNOSIS — S065X9A Traumatic subdural hemorrhage with loss of consciousness of unspecified duration, initial encounter: Secondary | ICD-10-CM | POA: Insufficient documentation

## 2023-08-31 DIAGNOSIS — S066X0A Traumatic subarachnoid hemorrhage without loss of consciousness, initial encounter: Secondary | ICD-10-CM | POA: Diagnosis not present

## 2023-08-31 DIAGNOSIS — R000 Tachycardia, unspecified: Secondary | ICD-10-CM | POA: Diagnosis not present

## 2023-08-31 DIAGNOSIS — Z96651 Presence of right artificial knee joint: Secondary | ICD-10-CM | POA: Diagnosis not present

## 2023-08-31 DIAGNOSIS — R110 Nausea: Secondary | ICD-10-CM | POA: Diagnosis not present

## 2023-08-31 DIAGNOSIS — Z853 Personal history of malignant neoplasm of breast: Secondary | ICD-10-CM | POA: Insufficient documentation

## 2023-08-31 DIAGNOSIS — S066X9A Traumatic subarachnoid hemorrhage with loss of consciousness of unspecified duration, initial encounter: Secondary | ICD-10-CM | POA: Diagnosis present

## 2023-08-31 DIAGNOSIS — I10 Essential (primary) hypertension: Secondary | ICD-10-CM | POA: Diagnosis present

## 2023-08-31 DIAGNOSIS — I609 Nontraumatic subarachnoid hemorrhage, unspecified: Secondary | ICD-10-CM | POA: Diagnosis not present

## 2023-08-31 DIAGNOSIS — Z7984 Long term (current) use of oral hypoglycemic drugs: Secondary | ICD-10-CM | POA: Insufficient documentation

## 2023-08-31 DIAGNOSIS — I6203 Nontraumatic chronic subdural hemorrhage: Secondary | ICD-10-CM | POA: Diagnosis present

## 2023-08-31 DIAGNOSIS — Z79899 Other long term (current) drug therapy: Secondary | ICD-10-CM | POA: Insufficient documentation

## 2023-08-31 DIAGNOSIS — S065X1A Traumatic subdural hemorrhage with loss of consciousness of 30 minutes or less, initial encounter: Secondary | ICD-10-CM

## 2023-08-31 DIAGNOSIS — E119 Type 2 diabetes mellitus without complications: Secondary | ICD-10-CM

## 2023-08-31 DIAGNOSIS — S065XAA Traumatic subdural hemorrhage with loss of consciousness status unknown, initial encounter: Secondary | ICD-10-CM | POA: Diagnosis present

## 2023-08-31 DIAGNOSIS — I672 Cerebral atherosclerosis: Secondary | ICD-10-CM | POA: Diagnosis not present

## 2023-08-31 DIAGNOSIS — R918 Other nonspecific abnormal finding of lung field: Secondary | ICD-10-CM | POA: Diagnosis not present

## 2023-08-31 DIAGNOSIS — I491 Atrial premature depolarization: Secondary | ICD-10-CM | POA: Diagnosis not present

## 2023-08-31 DIAGNOSIS — I959 Hypotension, unspecified: Secondary | ICD-10-CM | POA: Diagnosis not present

## 2023-08-31 DIAGNOSIS — R55 Syncope and collapse: Secondary | ICD-10-CM

## 2023-08-31 DIAGNOSIS — R0989 Other specified symptoms and signs involving the circulatory and respiratory systems: Secondary | ICD-10-CM | POA: Diagnosis not present

## 2023-08-31 DIAGNOSIS — S066X1A Traumatic subarachnoid hemorrhage with loss of consciousness of 30 minutes or less, initial encounter: Secondary | ICD-10-CM | POA: Diagnosis not present

## 2023-08-31 DIAGNOSIS — S065X0A Traumatic subdural hemorrhage without loss of consciousness, initial encounter: Secondary | ICD-10-CM | POA: Diagnosis not present

## 2023-08-31 DIAGNOSIS — I671 Cerebral aneurysm, nonruptured: Secondary | ICD-10-CM | POA: Diagnosis not present

## 2023-08-31 DIAGNOSIS — R42 Dizziness and giddiness: Secondary | ICD-10-CM | POA: Diagnosis not present

## 2023-08-31 DIAGNOSIS — S066XAA Traumatic subarachnoid hemorrhage with loss of consciousness status unknown, initial encounter: Secondary | ICD-10-CM | POA: Diagnosis not present

## 2023-08-31 DIAGNOSIS — I6523 Occlusion and stenosis of bilateral carotid arteries: Secondary | ICD-10-CM | POA: Diagnosis not present

## 2023-08-31 DIAGNOSIS — I629 Nontraumatic intracranial hemorrhage, unspecified: Secondary | ICD-10-CM | POA: Diagnosis not present

## 2023-08-31 LAB — COMPREHENSIVE METABOLIC PANEL
ALT: 12 U/L (ref 0–44)
AST: 26 U/L (ref 15–41)
Albumin: 3.8 g/dL (ref 3.5–5.0)
Alkaline Phosphatase: 56 U/L (ref 38–126)
Anion gap: 15 (ref 5–15)
BUN: 20 mg/dL (ref 8–23)
CO2: 19 mmol/L — ABNORMAL LOW (ref 22–32)
Calcium: 9.1 mg/dL (ref 8.9–10.3)
Chloride: 97 mmol/L — ABNORMAL LOW (ref 98–111)
Creatinine, Ser: 0.89 mg/dL (ref 0.44–1.00)
GFR, Estimated: >60 mL/min (ref >60)
Glucose, Bld: 126 mg/dL — ABNORMAL HIGH (ref 70–99)
Potassium: 4.2 mmol/L (ref 3.5–5.1)
Sodium: 131 mmol/L — ABNORMAL LOW (ref 135–145)
Total Bilirubin: 1 mg/dL (ref 0.3–1.2)
Total Protein: 6.6 g/dL (ref 6.5–8.1)

## 2023-08-31 LAB — CBC WITH DIFFERENTIAL/PLATELET
Abs Immature Granulocytes: 0.03 10*3/uL (ref 0.00–0.07)
Basophils Absolute: 0.1 10*3/uL (ref 0.0–0.1)
Basophils Relative: 1 %
Eosinophils Absolute: 0 10*3/uL (ref 0.0–0.5)
Eosinophils Relative: 0 %
HCT: 37.9 % (ref 36.0–46.0)
Hemoglobin: 12.2 g/dL (ref 12.0–15.0)
Immature Granulocytes: 0 %
Lymphocytes Relative: 12 %
Lymphs Abs: 1.2 10*3/uL (ref 0.7–4.0)
MCH: 29.2 pg (ref 26.0–34.0)
MCHC: 32.2 g/dL (ref 30.0–36.0)
MCV: 90.7 fL (ref 80.0–100.0)
Monocytes Absolute: 0.5 10*3/uL (ref 0.1–1.0)
Monocytes Relative: 5 %
Neutro Abs: 7.9 10*3/uL — ABNORMAL HIGH (ref 1.7–7.7)
Neutrophils Relative %: 82 %
Platelets: 260 10*3/uL (ref 150–400)
RBC: 4.18 MIL/uL (ref 3.87–5.11)
RDW: 14 % (ref 11.5–15.5)
WBC: 9.7 10*3/uL (ref 4.0–10.5)
nRBC: 0 % (ref 0.0–0.2)

## 2023-08-31 LAB — MAGNESIUM: Magnesium: 1.9 mg/dL (ref 1.7–2.4)

## 2023-08-31 LAB — TROPONIN I (HIGH SENSITIVITY)
Troponin I (High Sensitivity): 5 ng/L (ref <18)
Troponin I (High Sensitivity): 7 ng/L (ref <18)

## 2023-08-31 MED ORDER — SODIUM CHLORIDE 0.9 % IV SOLN: INTRAVENOUS | Status: DC

## 2023-08-31 MED ORDER — IOHEXOL 350 MG/ML SOLN
75.0000 mL | Freq: Once | INTRAVENOUS | Status: AC | PRN
  Administered 2023-08-31: 75 mL via INTRAVENOUS

## 2023-08-31 MED ORDER — ONDANSETRON HCL 4 MG PO TABS: 4.0000 mg | ORAL_TABLET | Freq: Four times a day (QID) | ORAL | Status: DC | PRN

## 2023-08-31 MED ORDER — IRBESARTAN 150 MG PO TABS: 150.0000 mg | ORAL_TABLET | Freq: Every day | ORAL | Status: DC

## 2023-08-31 MED ORDER — IRBESARTAN 150 MG PO TABS: 150.0000 mg | ORAL_TABLET | Freq: Two times a day (BID) | ORAL | Status: DC

## 2023-08-31 MED ORDER — ACETAMINOPHEN 160 MG/5ML PO SOLN: 650.0000 mg | ORAL | Status: DC | PRN

## 2023-08-31 MED ORDER — ACETAMINOPHEN 325 MG PO TABS
650.0000 mg | ORAL_TABLET | ORAL | Status: DC | PRN
  Administered 2023-09-01: 650 mg via ORAL
  Filled 2023-08-31: qty 2

## 2023-08-31 MED ORDER — ACETAMINOPHEN 500 MG PO TABS: 1000.0000 mg | ORAL_TABLET | Freq: Four times a day (QID) | ORAL | Status: DC | PRN

## 2023-08-31 MED ORDER — STROKE: EARLY STAGES OF RECOVERY BOOK: Freq: Once | Status: DC

## 2023-08-31 MED ORDER — ACETAMINOPHEN 325 MG RE SUPP: 650.0000 mg | RECTAL | Status: DC | PRN

## 2023-08-31 MED ORDER — LEVETIRACETAM 500 MG PO TABS
500.0000 mg | ORAL_TABLET | Freq: Two times a day (BID) | ORAL | Status: DC
  Administered 2023-08-31 – 2023-09-01 (×2): 500 mg via ORAL
  Filled 2023-08-31 (×2): qty 1

## 2023-08-31 MED ORDER — METOPROLOL TARTRATE 5 MG/5ML IV SOLN: 2.5000 mg | INTRAVENOUS | Status: DC | PRN

## 2023-08-31 MED ORDER — PANTOPRAZOLE SODIUM 40 MG IV SOLR
40.0000 mg | Freq: Two times a day (BID) | INTRAVENOUS | Status: DC
  Administered 2023-08-31 – 2023-09-01 (×2): 40 mg via INTRAVENOUS
  Filled 2023-08-31 (×2): qty 10

## 2023-08-31 MED ORDER — HYDROCHLOROTHIAZIDE 12.5 MG PO TABS: 6.2500 mg | ORAL_TABLET | Freq: Two times a day (BID) | ORAL | Status: DC

## 2023-08-31 MED ORDER — ATORVASTATIN CALCIUM 20 MG PO TABS
40.0000 mg | ORAL_TABLET | Freq: Every day | ORAL | Status: DC
  Administered 2023-09-01: 40 mg via ORAL
  Filled 2023-08-31: qty 2

## 2023-08-31 NOTE — ED Notes (Signed)
[facility] - daughter [place] - [phone]  [facility] - daughter [place] - [phone]

## 2023-08-31 NOTE — ED Provider Notes (Signed)
Procedures  Clinical Course as of 09/01/23 1844  Sat Aug 31, 2023  1557 144/82, hr 85 [PS]    Clinical Course User Index [PS] Sharman Cheek, MD    ----------------------------------------- 3:41 PM on 08/31/2023 -----------------------------------------  D/w [facility] NSGY/neuro IR Dr. Sherryll Burger who feels ACA aneurysm is unlikely the culprit of today's presentation given totality of imaging findings. He will d/w [facility] NSGY Dr. Katrinka Blazing to determine most appropriate disposition.  ----------------------------------------- 3:48 PM on 08/31/2023 ----------------------------------------- Recommendation is for repeat CTH at 6h interval. If stable, pt can be admitted at [facility] for syncope workup with our NSGY consulting.    Sharman Cheek, MD 09/01/23 [id number]

## 2023-08-31 NOTE — ED Notes (Signed)
ERP at bedside, family at bedside

## 2023-08-31 NOTE — ED Provider Notes (Signed)
[facility] Provider Note   Event Date/Time   First MD Initiated Contact with Patient 08/31/23 1131     (approximate) History  Near Syncope  HPI Brenda Landry is a 82 y.o. female with a past medical history of hypertension and diabetes who presents via EMS after a near syncopal episode.  EMS state that patient was walking with her walking group when she returned home and had an episode of lightheadedness where she had to be helped down into a chair without any appreciable trauma.  Patient regained consciousness within 1 minute and was noted to complain of headache to have mild confusion.  EMS notes the patient confusion cleared upon transport and patient only complains of mild headache at this time. ROS: Patient currently denies any vision changes, tinnitus, difficulty speaking, facial droop, sore throat, chest pain, shortness of breath, abdominal pain, nausea/vomiting/diarrhea, dysuria, or weakness/numbness/paresthesias in any extremity   Physical Exam  Triage Vital Signs: ED Triage Vitals  Encounter Vitals Group     BP 08/31/23 1135 (!) 151/87     Systolic BP Percentile --      Diastolic BP Percentile --      Pulse Rate 08/31/23 1135 93     Resp 08/31/23 1135 (!) 22     Temp 08/31/23 1135 (!) 97.4 F (36.3 C)     Temp Source 08/31/23 1135 Oral     SpO2 08/31/23 1135 98 %     Weight 08/31/23 1136 127 lb 13.9 oz (58 kg)     Height 08/31/23 1136 5\' 3"  (1.6 m)     Head Circumference --      Peak Flow --      Pain Score 08/31/23 1136 1     Pain Loc --      Pain Education --      Exclude from Growth Chart --    Most recent vital signs: Vitals:   08/31/23 1245 08/31/23 1445  BP:  (!) 158/79  Pulse: 87 92  Resp: 14 (!) 26  Temp:    SpO2: 100% 100%   General: Awake, oriented x4. CV:  Good peripheral perfusion.  Resp:  Normal effort.  Abd:  No distention.  Other:  Elderly well-developed, well-nourished Caucasian female resting comfortably in no acute  distress ED Results / Procedures / Treatments  Labs (all labs ordered are listed, but only abnormal results are displayed) Labs Reviewed  COMPREHENSIVE METABOLIC PANEL - Abnormal; Notable for the following components:      Result Value   Sodium 131 (*)    Chloride 97 (*)    CO2 19 (*)    Glucose, Bld 126 (*)    All other components within normal limits  CBC WITH DIFFERENTIAL/PLATELET - Abnormal; Notable for the following components:   Neutro Abs 7.9 (*)    All other components within normal limits  TROPONIN I (HIGH SENSITIVITY)  TROPONIN I (HIGH SENSITIVITY)   EKG ED ECG REPORT I, Merwyn Katos, the attending physician, personally viewed and interpreted this ECG. Date: 08/31/2023 EKG Time: 1134 Rate: 90 Rhythm: normal sinus rhythm QRS Axis: normal Intervals: normal ST/T Wave abnormalities: normal Narrative Interpretation: no evidence of acute ischemia RADIOLOGY ED MD interpretation: CT of the head without IV contrast interpreted independently by me and shows positive for small predominantly low-density subdural hematomas over the superior convexities with 2 to 3 mm on each side.  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 Multiplanar CT image reconstructions and MIPs were obtained to evaluate the vascular anatomy. Carotid stenosis measurements (when applicable) are obtained  utilizing NASCET criteria, using the distal internal carotid diameter as the denominator. RADIATION DOSE REDUCTION: This exam was performed according to the departmental dose-optimization program which includes automated exposure control, adjustment of the mA and/or kV according to patient size and/or use of iterative reconstruction technique. CONTRAST:  75mL OMNIPAQUE IOHEXOL 350 MG/ML SOLN COMPARISON:  Head CT earlier same day FINDINGS: CTA NECK FINDINGS Aortic arch: Aortic atherosclerosis.  Branching pattern is normal. Right carotid system: Common carotid artery widely patent to the bifurcation. Calcified plaque at the carotid bifurcation and ICA bulb but no stenosis when compared to the diameter of the more distal cervical ICA. The ICA is tortuous just beneath the skull base but there is no stenosis or pseudo aneurysm. Left carotid system: Common carotid artery widely patent to the bifurcation. Calcified plaque at the bifurcation but no stenosis. Cervical ICA is normal. Vertebral arteries: The right vertebral artery origin is widely patent. There is dense calcified plaque adjacent to the left vertebral artery origin but without visible stenosis. Both vessels are widely patent through the cervical region to the foramen magnum. Skeleton: Ordinary cervical spondylosis. Other neck: No mass or lymphadenopathy. Upper chest: Mild pleural and parenchymal scarring in the anterior right chest. Review of the MIP images confirms the above findings CTA HEAD FINDINGS Anterior circulation: Both internal carotid arteries are patent through the skull base and siphon regions. Siphon atherosclerotic calcification but no stenosis greater than 30%. The anterior and middle cerebral vessels are patent. No large vessel occlusion or proximal stenosis. 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 Posterior circulation: Both vertebral arteries are widely patent through the foramen magnum to the basilar  artery. No basilar stenosis. Posterior circulation branch vessels are patent. Left PCA receives most of it supply from the anterior circulation. Venous sinuses: Patent and normal. Anatomic variants: None significant. Review of the MIP images confirms the above findings IMPRESSION: 1. 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 This would not seem to relate to the traumatic pattern intracranial hemorrhage described earlier. 2. No intracranial large vessel occlusion or correctable proximal stenosis. 3. Aortic atherosclerosis. 4. Atherosclerotic change at both carotid bifurcations but without stenosis. Aortic Atherosclerosis (ICD10-I70.0). Electronically Signed   By: Paulina Fusi M.D.   On: 08/31/2023 14:44   CT Head Wo Contrast  Addendum Date: 08/31/2023   ADDENDUM REPORT: 08/31/2023 13:23 ADDENDUM: Study discussed by telephone with Dr. Donna Bernard on 08/31/2023 at 1256 hours. Electronically Signed   By: Odessa Fleming M.D.   On: 08/31/2023 13:23   Result Date: 08/31/2023 CLINICAL DATA:  82 year old female with dizziness this morning. EXAM: CT HEAD WITHOUT CONTRAST TECHNIQUE: Contiguous axial images were obtained from the base of the skull through the vertex without intravenous contrast. RADIATION DOSE REDUCTION: This exam was performed according to the departmental dose-optimization program which includes automated exposure  control, adjustment of the mA and/or kV according to patient size and/or use of iterative reconstruction technique. COMPARISON:  None Available. FINDINGS: Brain: Cerebral volume is within normal limits for age. There are scattered small hyperdense hemorrhagic foci in the right hemisphere (series 2, image 22, series 2, image 15 and coronal image 34) which are in the right subdural and possibly also right subarachnoid space. Overall right side subdural space predominantly low-density enlargement is up to 3 mm. There is no subsequent intracranial mass effect or  midline shift. No intraventricular hemorrhage or ventriculomegaly. However, there does appear to be a similar small left-side superior convexity low-density subdural collection on coronal image 26, 2-3 mm. Basilar cisterns appear normal and no other evidence of subarachnoid blood identified. No superimposed No cortically based acute infarct identified. Gray-white differentiation is within normal limits for age. Vascular: No suspicious intracranial vascular hyperdensity. Calcified atherosclerosis at the skull base. Skull: No acute osseous abnormality identified. Sinuses/Orbits: Minor mucosal thickening and bubbly opacity in the left sphenoid sinus. Other visualized paranasal sinuses and mastoids are well aerated. Tympanic cavities appear clear. Other: No acute orbit or scalp soft tissue finding. IMPRESSION: 1. Positive for small, predominantly low-density Subdural Hematomas over the superior convexities, 2-3 mm on each side. And a superimposed small volume of right hemisphere Subarachnoid Blood (sagittal image 12). 2. No associated intracranial mass effect or midline shift. No other acute intracranial abnormality. Electronically Signed: By: Odessa Fleming M.D. On: 08/31/2023 12:48   DG Chest Port 1 View  Result Date: 08/31/2023 CLINICAL DATA:  Syncope, dizziness EXAM: PORTABLE CHEST - 1 VIEW COMPARISON:  04/26/2006 FINDINGS: Relatively low lung volumes. Scattered patchy opacities at the right lung base. Heart size and mediastinal contours are within normal limits. Aortic Atherosclerosis (ICD10-170.0). No effusion. Visualized bones unremarkable. IMPRESSION: Patchy right basilar opacities, possibly atelectasis. Electronically Signed   By: Corlis Leak M.D.   On: 08/31/2023 12:24   PROCEDURES: Critical Care performed: Yes, see critical care procedure note(s) .1-3 Lead EKG Interpretation  Performed by: Merwyn Katos, MD Authorized by: Merwyn Katos, MD     Interpretation: normal     ECG rate:  71   ECG rate  assessment: normal     Rhythm: sinus rhythm     Ectopy: none     Conduction: normal   CRITICAL CARE Performed by: Merwyn Katos  Total critical care time: 41 minutes  Critical care time was exclusive of separately billable procedures and treating other patients.  Critical care was necessary to treat or prevent imminent or life-threatening deterioration.  Critical care was time spent personally by me on the following activities: development of treatment plan with patient and/or surrogate as well as nursing, discussions with consultants, evaluation of patient's response to treatment, examination of patient, obtaining history from patient or surrogate, ordering and performing treatments and interventions, ordering and review of laboratory studies, ordering and review of radiographic studies, pulse oximetry and re-evaluation of patient's condition.  MEDICATIONS ORDERED IN ED: Medications  iohexol (OMNIPAQUE) 350 MG/ML injection 75 mL (75 mLs Intravenous Contrast Given 08/31/23 1351)   IMPRESSION / MDM / ASSESSMENT AND PLAN / ED COURSE  I reviewed the triage vital signs and the nursing notes.                             The patient is on the cardiac monitor to evaluate for evidence of arrhythmia and/or significant heart rate changes. Patient's presentation is  most consistent with acute presentation with potential threat to life or bodily function. Patient is a 82 year old female with the above-stated past medical history who presents after syncopal episode - neurological deficits from baseline + elevated blood pressures to 180sbp on scene  Unlikely infectious etiology or changes secondary to ingestion  Workup: POCT glucose. CBC, BMP, PT/INR, PTT, troponin, type and screen ECG and non-contrast head CT  Findings: CT Brain: intracranial hemorrhage in the right convexities and right cerebral hemisphere of subdurals and subarachnoid ECG: No cerebral T waves. No STEMI  Interventions: BP  Control: PRN Nicardipine 5mg /hr by slow infusion (61ml/hr) titrating to maximum of 30mg /hr with permissive hypertension (SBP goal 180)  Consults: Neurosurgery who recommends transfer for IR intervention  Dispo: Transfer for neuro interventional radiology   FINAL CLINICAL IMPRESSION(S) / ED DIAGNOSES   Final diagnoses:  Subarachnoid hemorrhage (HCC)  Subdural hematoma (HCC)  Syncope and collapse   Rx / DC Orders   ED Discharge Orders     None      Note:  This document was prepared using Dragon voice recognition software and may include unintentional dictation errors.   Merwyn Katos, MD 08/31/23 [phone]

## 2023-08-31 NOTE — Assessment & Plan Note (Signed)
.  CH

## 2023-08-31 NOTE — Progress Notes (Signed)
Follow-up on imaging demonstrates some hyperdensity in the follow-up CT scan.  Given the fact that there is no increase in mass effect there are 2 likely explanations.  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  Given the hazy pattern of the hyperdensity as well as it appearing to follow loculations currently favor the contrast explanation.  We could follow-up with a repeat head CT in the morning as she has not having any new or emergent neurologic issues.  Images below for comparison

## 2023-08-31 NOTE — Assessment & Plan Note (Signed)
Per NS note pt to start keppra for seizure prevention.

## 2023-08-31 NOTE — ED Notes (Signed)
Pt and family updated on plan to see a neurosurgeon at some point, and get another CT scan then decide plan. No changed in pt status at this time.

## 2023-08-31 NOTE — H&P (Signed)
History and Physical    Patient: Brenda Landry UJW:[id number] DOB: [birth year] DOA: 08/31/2023 DOS: the patient was seen and examined on 08/31/2023 PCP: Marguarite Arbour, MD  Patient coming from: Home  Chief Complaint:  Chief Complaint  Patient presents with   Near Syncope    HPI: Brenda Landry is a 82 y.o. female with medical history significant for diabetes mellitus type 2, hypertension, osteoarthritis, depression coming today via EMS for dizziness/near syncope. Per report patient was walking in the morning she returned home had an episode of dizziness she was helped down into the chair and did not fall or strike her head.  Patient did lose consciousness and within 1 minute was awake again and was mildly confused.  Patient's confusion was clear and pt had a headache.  With no report of vision changes, tinnitus, difficulty speaking, facial droop, sore throat, chest pain, shortness of breath, abdominal pain, nausea/vomiting/diarrhea, dysuria, or weakness/numbness/paresthesias.  Initial vitals show blood pressure 151/87 heart rate of 93 respirations 22 temperature of 97.4. Metabolic panel shows sodium of 131 chloride 97 bicarb of 19 glucose 126 normal normal troponin. Normal CBC. 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  Repeat head CT of the subdural hematoma shows interval increase increase from 2 to 3 mm on each side 11 mm on the left and 13 mm on the right parietal convexities or midline shift patient also has superimposed small volume acute subarachnoid hemorrhage in the right parietal convexity. EKG done today: low voltage EKG tracing sinus rhythm of 140 QTc of 448 normal interval and upright  axis.  Review of Systems: Review of Systems  Unable to perform ROS: Acuity of condition    Past Medical History:  Diagnosis Date   Breast cancer (HCC) 12/03/2005   lumpectomy, XRT, Arimadex   Colon polyps 12/04/2003   Depression    DM (diabetes mellitus) (HCC)    HTN (hypertension)    Hyperlipidemia    Osteoarthritis    Osteopenia    Personal history of radiation therapy 2007   Vitamin D deficiency    Past Surgical History:  Procedure Laterality Date   APPENDECTOMY     BREAST BIOPSY Left 07/16/2012   BENIGN BREAST PARENCHYMA WITH FIBROCYSTIC CHANGE AND USUAL DUCTAL HYPERPLASIA   BREAST LUMPECTOMY Right 12/03/2005   COLONOSCOPY N/A 08/29/2016   Procedure: COLONOSCOPY;  Surgeon: Corbin Ade, MD;  Location: [facility];  Service: Endoscopy;  Laterality: N/A;  12:00 pm   EYE SURGERY     cataract   POLYPECTOMY     TOTAL KNEE ARTHROPLASTY Right 01/31/2023   Procedure: TOTAL KNEE ARTHROPLASTY;  Surgeon: Reinaldo Berber, MD;  Location: [facility];  Service: Orthopedics;  Laterality: Right;   VAGINAL HYSTERECTOMY  12/03/2009   partial   Social History:   reports that she has never smoked. She has never used smokeless tobacco. She reports that she does not drink alcohol and does not use drugs.  Allergies  Allergen Reactions   Codeine Itching    Family History  Problem Relation Age of Onset   Diabetes Mother    Stroke Mother    Heart disease Father    Prostate cancer Brother    Ovarian cancer Sister  female   Colon cancer Neg Hx    Liver disease Neg Hx     Prior to Admission medications   Medication Sig Start Date End Date Taking? Authorizing Provider  acetaminophen (TYLENOL) 500 MG tablet Take 1,000 mg by mouth every 6 (six) hours as needed.    [provider]  atorvastatin (LIPITOR) 40 MG tablet Take 40 mg by mouth daily.      [provider]  docusate sodium (COLACE) 100 MG capsule Take 1 capsule (100 mg total) by mouth 2 (two) times  daily. 02/01/23   Evon Slack, PA-C  enalapril (VASOTEC) 5 MG tablet Take 5 mg by mouth daily.      [provider]  enoxaparin (LOVENOX) 40 MG/0.4ML injection Inject 0.4 mLs (40 mg total) into the skin daily for 14 days. 02/01/23 02/15/23  Evon Slack, PA-C  imipramine (TOFRANIL) 10 MG tablet Take 10 mg by mouth at bedtime.    [provider]  magnesium oxide (MAG-OX) 400 (240 Mg) MG tablet Take 1 tablet by mouth 4 (four) times daily. 01/10/23   [provider]  metFORMIN (GLUCOPHAGE) 500 MG tablet Take 1,000 mg by mouth 2 (two) times daily with a meal.    [provider]  omeprazole (PRILOSEC) 40 MG capsule Take 40 mg by mouth daily.    [provider]  ondansetron (ZOFRAN) 4 MG tablet Take 1 tablet (4 mg total) by mouth every 6 (six) hours as needed for nausea. 02/01/23   Evon Slack, PA-C  traMADol (ULTRAM) 50 MG tablet Take 1 tablet (50 mg total) by mouth every 6 (six) hours as needed for moderate pain. 02/01/23   Evon Slack, PA-C  traZODone (DESYREL) 50 MG tablet Take 50 mg by mouth at bedtime.    [provider]  triamterene-hydrochlorothiazide (DYAZIDE) 37.5-25 MG capsule Take 1 capsule by mouth daily.    [provider]  valsartan (DIOVAN) 320 MG tablet Take 320 mg by mouth daily.    [provider]     Vitals:   08/31/23 1900 08/31/23 1930 08/31/23 2000 08/31/23 2030  BP: (!) 123/57 134/71 105/74 (!) 110/49  Pulse: 84 79 81 85  Resp: (!) 24 13 18 20   Temp:      TempSrc:      SpO2: 98% 100% 98% 96%  Weight:      Height:       Physical Exam Vitals and nursing note reviewed.  Constitutional:      General: She is not in acute distress. HENT:     Head: Normocephalic and atraumatic.     Right Ear: Hearing normal.     Left Ear: Hearing normal.     Nose: Nose normal. No nasal deformity.     Mouth/Throat:     Lips: Pink.     Tongue: No lesions.     Pharynx: Oropharynx is clear.  Eyes:      General: Lids are normal.     Extraocular Movements: Extraocular movements intact.  Cardiovascular:     Rate and Rhythm: Normal rate and regular rhythm.     Heart sounds: Normal heart sounds.  Pulmonary:     Effort: Pulmonary effort is normal.     Breath sounds: Normal breath sounds.  Abdominal:     General: Bowel sounds are normal. There is no distension.     Palpations: Abdomen is soft. There is no mass.     Tenderness: There is no abdominal tenderness.  Musculoskeletal:  Right lower leg: No edema.     Left lower leg: No edema.  Skin:    General: Skin is warm.  Neurological:     General: No focal deficit present.     Mental Status: She is alert and oriented to person, place, and time.     Cranial Nerves: Cranial nerves 2-12 are intact.  Psychiatric:        Attention and Perception: Attention normal.        Mood and Affect: Mood normal.        Speech: Speech normal.        Behavior: Behavior normal. Behavior is cooperative.     Labs on Admission: I have personally reviewed following labs and imaging studies.  CBC: Recent Labs  Lab 08/31/23 1146  WBC 9.7  NEUTROABS 7.9*  HGB 12.2  HCT 37.9  MCV 90.7  PLT 260   Basic Metabolic Panel: Recent Labs  Lab 08/31/23 1146  NA 131*  K 4.2  CL 97*  CO2 19*  GLUCOSE 126*  BUN 20  CREATININE 0.89  CALCIUM 9.1   GFR: Estimated Creatinine Clearance: 40.3 mL/min (by C-G formula based on SCr of 0.89 mg/dL). Liver Function Tests: Recent Labs  Lab 08/31/23 1146  AST 26  ALT 12  ALKPHOS 56  BILITOT 1.0  PROT 6.6  ALBUMIN 3.8   No results for input(s): "LIPASE", "AMYLASE" in the last 168 hours. No results for input(s): "AMMONIA" in the last 168 hours. Coagulation Profile: No results for input(s): "INR", "PROTIME" in the last 168 hours. Cardiac Enzymes: No results for input(s): "CKTOTAL", "CKMB", "CKMBINDEX", "TROPONINI" in the last 168 hours. BNP (last 3 results) No results for input(s): "PROBNP" in the last  8760 hours. HbA1C: No results for input(s): "HGBA1C" in the last 72 hours. CBG: No results for input(s): "GLUCAP" in the last 168 hours. Lipid Profile: No results for input(s): "CHOL", "HDL", "LDLCALC", "TRIG", "CHOLHDL", "LDLDIRECT" in the last 72 hours. Thyroid Function Tests: No results for input(s): "TSH", "T4TOTAL", "FREET4", "T3FREE", "THYROIDAB" in the last 72 hours. Anemia Panel: No results for input(s): "VITAMINB12", "FOLATE", "FERRITIN", "TIBC", "IRON", "RETICCTPCT" in the last 72 hours.  Medications  levETIRAcetam (KEPPRA) tablet 500 mg (500 mg Oral Given 08/31/23 1651)  iohexol (OMNIPAQUE) 350 MG/ML injection 75 mL (75 mLs Intravenous Contrast Given 08/31/23 1351)   Radiological Exams on Admission: CT Head Wo Contrast  Result Date: 08/31/2023 CLINICAL DATA:  Follow-up examination for intracranial hemorrhage, subdural hematomas on CT exam from earlier the same day. EXAM: CT HEAD WITHOUT CONTRAST TECHNIQUE: Contiguous axial images were obtained from the base of the skull through the vertex without intravenous contrast. RADIATION DOSE REDUCTION: This exam was performed according to the departmental dose-optimization program which includes automated exposure control, adjustment of the mA and/or kV according to patient size and/or use of iterative reconstruction technique. COMPARISON:  Prior CT from earlier the same day. FINDINGS: Brain: Previously identified subdural hematomas have increased in size, now measuring up to 11 mm on the left and 13 mm on the right at the level of the parietal convexities. Collections demonstrate intermediate density. Superimposed small volume acute subarachnoid hemorrhage at the right parietal convexity again noted, relatively stable from prior. No significant mass effect or midline shift. No other new acute intracranial hemorrhage. No acute large vessel territory infarct. No mass lesion or hydrocephalus. Vascular: Some contrast material remains on board from  prior CTA performed earlier the same day. Scattered calcified atherosclerosis present at the skull base. Skull: Scalp  soft tissues demonstrate no new finding. Calvarium intact. Sinuses/Orbits: Globes and orbital soft tissues within normal limits. Paranasal sinuses remain largely clear. No mastoid effusion. Other: None. IMPRESSION: 1. Interval increase in size of bilateral subdural hematomas, now measuring up to 11 mm on the left and 13 mm on the right at the level of the parietal convexities. No significant mass effect or midline shift. 2. Superimposed small volume acute subarachnoid hemorrhage at the right parietal convexity, relatively stable from prior. 3. No other new acute intracranial abnormality. Electronically Signed   By: Rise Mu M.D.   On: 08/31/2023 19:44   CT ANGIO HEAD NECK W WO CM  Result Date: 08/31/2023 CLINICAL DATA:  Acute onset of dizziness today with intracranial hemorrhage shown on prior CT EXAM: CT ANGIOGRAPHY HEAD AND NECK WITH AND WITHOUT CONTRAST TECHNIQUE: Multidetector CT imaging of the head and neck was performed using the standard protocol during bolus administration of intravenous contrast. Multiplanar CT image reconstructions and MIPs were obtained to evaluate the vascular anatomy. Carotid stenosis measurements (when applicable) are obtained utilizing NASCET criteria, using the distal internal carotid diameter as the denominator. RADIATION DOSE REDUCTION: This exam was performed according to the departmental dose-optimization program which includes automated exposure control, adjustment of the mA and/or kV according to patient size and/or use of iterative reconstruction technique. CONTRAST:  75mL OMNIPAQUE IOHEXOL 350 MG/ML SOLN COMPARISON:  Head CT earlier same day FINDINGS: CTA NECK FINDINGS Aortic arch: Aortic atherosclerosis.  Branching pattern is normal. Right carotid system: Common carotid artery widely patent to the bifurcation. Calcified plaque at the carotid  bifurcation and ICA bulb but no stenosis when compared to the diameter of the more distal cervical ICA. The ICA is tortuous just beneath the skull base but there is no stenosis or pseudo aneurysm. Left carotid system: Common carotid artery widely patent to the bifurcation. Calcified plaque at the bifurcation but no stenosis. Cervical ICA is normal. Vertebral arteries: The right vertebral artery origin is widely patent. There is dense calcified plaque adjacent to the left vertebral artery origin but without visible stenosis. Both vessels are widely patent through the cervical region to the foramen magnum. Skeleton: Ordinary cervical spondylosis. Other neck: No mass or lymphadenopathy. Upper chest: Mild pleural and parenchymal scarring in the anterior right chest. Review of the MIP images confirms the above findings CTA HEAD FINDINGS Anterior circulation: Both internal carotid arteries are patent through the skull base and siphon regions. Siphon atherosclerotic calcification but no stenosis greater than 30%. The anterior and middle cerebral vessels are patent. No large vessel occlusion or proximal stenosis. 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 Posterior circulation: Both vertebral arteries are widely patent through the foramen magnum to the basilar artery. No basilar stenosis. Posterior circulation branch vessels are patent. Left PCA receives most of it supply from the anterior circulation. Venous sinuses: Patent and normal. Anatomic variants: None significant. Review of the MIP images confirms the above findings IMPRESSION: 1. 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 This would not seem to relate to the traumatic pattern intracranial hemorrhage described earlier. 2. No intracranial large vessel occlusion or correctable proximal stenosis. 3. Aortic atherosclerosis. 4. Atherosclerotic change at both carotid bifurcations but without  stenosis. Aortic Atherosclerosis (ICD10-I70.0). Electronically Signed   By: Paulina Fusi M.D.   On: 08/31/2023 14:44   CT Head Wo Contrast  Addendum Date: 08/31/2023   ADDENDUM REPORT: 08/31/2023 13:23 ADDENDUM: Study discussed by telephone with  Dr. Donna Bernard on 08/31/2023 at 1256 hours. Electronically Signed   By: Odessa Fleming M.D.   On: 08/31/2023 13:23   Result Date: 08/31/2023 CLINICAL DATA:  82 year old female with dizziness this morning. EXAM: CT HEAD WITHOUT CONTRAST TECHNIQUE: Contiguous axial images were obtained from the base of the skull through the vertex without intravenous contrast. RADIATION DOSE REDUCTION: This exam was performed according to the departmental dose-optimization program which includes automated exposure control, adjustment of the mA and/or kV according to patient size and/or use of iterative reconstruction technique. COMPARISON:  None Available. FINDINGS: Brain: Cerebral volume is within normal limits for age. There are scattered small hyperdense hemorrhagic foci in the right hemisphere (series 2, image 22, series 2, image 15 and coronal image 34) which are in the right subdural and possibly also right subarachnoid space. Overall right side subdural space predominantly low-density enlargement is up to 3 mm. There is no subsequent intracranial mass effect or midline shift. No intraventricular hemorrhage or ventriculomegaly. However, there does appear to be a similar small left-side superior convexity low-density subdural collection on coronal image 26, 2-3 mm. Basilar cisterns appear normal and no other evidence of subarachnoid blood identified. No superimposed No cortically based acute infarct identified. Gray-white differentiation is within normal limits for age. Vascular: No suspicious intracranial vascular hyperdensity. Calcified atherosclerosis at the skull base. Skull: No acute osseous abnormality identified. Sinuses/Orbits: Minor mucosal thickening and bubbly opacity in the  left sphenoid sinus. Other visualized paranasal sinuses and mastoids are well aerated. Tympanic cavities appear clear. Other: No acute orbit or scalp soft tissue finding. IMPRESSION: 1. Positive for small, predominantly low-density Subdural Hematomas over the superior convexities, 2-3 mm on each side. And a superimposed small volume of right hemisphere Subarachnoid Blood (sagittal image 12). 2. No associated intracranial mass effect or midline shift. No other acute intracranial abnormality. Electronically Signed: By: Odessa Fleming M.D. On: 08/31/2023 12:48   DG Chest Port 1 View  Result Date: 08/31/2023 CLINICAL DATA:  Syncope, dizziness EXAM: PORTABLE CHEST - 1 VIEW COMPARISON:  04/26/2006 FINDINGS: Relatively low lung volumes. Scattered patchy opacities at the right lung base. Heart size and mediastinal contours are within normal limits. Aortic Atherosclerosis (ICD10-170.0). No effusion. Visualized bones unremarkable. IMPRESSION: Patchy right basilar opacities, possibly atelectasis. Electronically Signed   By: Corlis Leak M.D.   On: 08/31/2023 12:24     Data Reviewed: Relevant notes from primary care and specialist visits, past discharge summaries as available in EHR, including Care Everywhere. 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  Admission requested for conservative and ongoing management with neurosurgery on board.  >> Syncope: 2/2 SDH/ SAH. We will  monitor in stepdown. No blurred vision.  No incontinence. No seizures.  Neurologically nonfocal.  >>Traumatic SAH/ SDH:  Fall precaution. Seizure precaution.  Aspiration precaution. Swallow eval / Speech consult as needed. BP meds split to prevent any fluctuation and hydrochlorothiazide dose lowered to 6.25 mg bid. PRN lopressor IV q 4. Goal Systolic in 130-140's. Neurosurgery consult.  Echo ordered.  DVT prophyaxis - mechanical.  Hold and discontinue tramadol as it may precipitate seizures.  Keppra 500 mg bid x 1 week.   >>HTN:  Home meds ; dyazide changed to low hydrochlorothiazide bid.  Diovan 320 to irbesartan 150 bid. Dose modified to prevent fluctuation and hypotension.  Monitor closely .   >>DMII: Sliding scale insulin with accucheck q4.     Prognosis: Guarded  DVT prophylaxis:  scd's  Consults:  Neurosurgery: Si Gaul  Advance Care Planning:    Code Status: Prior   Family Communication:  Maudie Flakes - daughter [place] - [phone]   Truman Hayward - daughter [place] - [phone]  Disposition Plan:  TBD   Severity of Illness: The appropriate patient status for this patient is INPATIENT. Inpatient status is judged to be reasonable and necessary in order to provide the required intensity of service to ensure the patient's safety. The patient's presenting symptoms, physical exam findings, and initial radiographic and laboratory data in the context of their chronic comorbidities is felt to place them at high risk for further clinical deterioration. Furthermore, it is not anticipated that the patient will be medically stable for discharge from the hospital within 2 midnights of admission.   * I certify that at the point of admission it is my clinical judgment that the patient will require inpatient hospital care spanning beyond 2 midnights from the point of admission due to high intensity of service, high risk for further deterioration and  high frequency of surveillance required.*  Author: Gertha Calkin, MD 08/31/2023 9:32 PM  For on call review [email or web address].

## 2023-08-31 NOTE — ED Triage Notes (Signed)
Pt from home was taking a walk this morning and had an episode of dizziness when talking with her friends, per EMS no LOC. 4 mg zofran odt, 12.5 mg phenergan given for reported nausea. Pt did get a prescription for a UTI yesterday.

## 2023-08-31 NOTE — Consult Note (Signed)
Consulting Department:  ED  Primary Physician:  Marguarite Arbour, MD  Chief Complaint:   Subdural hematomas, subarachnoid hemorrhage  History of Present Illness: 08/31/2023 Brenda Landry is a 82 y.o. 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  She was in her usual state of health today until she had a syncopal episode while talking to her family members.  She states that this was not preceded by any prodromal headache neck pain or worst headache of her life.  Notably she is currently being treated for a urinary tract infection, and states that she was slightly less hydrated than she normally would be this morning.  Rather than her 2 cups of coffee and 4 bottles of water she had 1 cup of coffee and a half a bottle of water.  She does not have any seizures.  She was unconscious for less than 1 minute.  She was confused initially but regained her composure.  Not having any weakness numbness or tingling.  Not having any radiating pain down her back.  No stiffness in her neck.  No changes to vision.  No changes to memory.  No history of a CSF leak.  She does not use tobacco.  No family history of intracranial aneurysms.  No recent traumas that she can remember.  Review of Systems:  Neurologic review of systems negative unless otherwise specified above  Past Medical History: Past Medical History:  Diagnosis Date   Breast cancer (HCC) 12/03/2005   lumpectomy, XRT, Arimadex   Colon polyps 12/04/2003   Depression    DM (diabetes mellitus) (HCC)    HTN (hypertension)    Hyperlipidemia    Osteoarthritis    Osteopenia    Personal history of radiation therapy 2007   Vitamin D deficiency     Past Surgical History: Past Surgical History:  Procedure Laterality Date   APPENDECTOMY     BREAST BIOPSY Left 07/16/2012   BENIGN BREAST PARENCHYMA WITH FIBROCYSTIC CHANGE AND USUAL DUCTAL HYPERPLASIA   BREAST LUMPECTOMY Right  12/03/2005   COLONOSCOPY N/A 08/29/2016   Procedure: COLONOSCOPY;  Surgeon: Corbin Ade, MD;  Location: [facility];  Service: Endoscopy;  Laterality: N/A;  12:00 pm   EYE SURGERY     cataract   POLYPECTOMY     TOTAL KNEE ARTHROPLASTY Right 01/31/2023   Procedure: TOTAL KNEE ARTHROPLASTY;  Surgeon: Reinaldo Berber, MD;  Location: [facility];  Service: Orthopedics;  Laterality: Right;   VAGINAL HYSTERECTOMY  12/03/2009   partial    Allergies: Allergies as of 08/31/2023 - Review Complete 08/31/2023  Allergen Reaction Noted   Codeine Itching 08/14/2016    Medications:  Current Facility-Administered Medications:    levETIRAcetam (KEPPRA) tablet 500 mg, 500 mg, Oral, BID, Sharman Cheek, MD, 500 mg at 08/31/23 1651  Current Outpatient Medications:    acetaminophen (TYLENOL) 500 MG tablet, Take 1,000 mg by mouth every 6 (six) hours as needed., Disp: , Rfl:    atorvastatin (LIPITOR) 40 MG tablet, Take 40 mg by mouth daily.  , Disp: , Rfl:    docusate sodium (COLACE) 100 MG capsule, Take 1 capsule (100 mg total) by mouth 2 (two) times daily., Disp: 10 capsule, Rfl: 0   enalapril (VASOTEC) 5 MG tablet, Take 5 mg by mouth daily.  , Disp: , Rfl:    enoxaparin (LOVENOX) 40 MG/0.4ML injection, Inject 0.4 mLs (40 mg total) into the skin daily for 14 days., Disp: 5.6 mL, Rfl: 0  imipramine (TOFRANIL) 10 MG tablet, Take 10 mg by mouth at bedtime., Disp: , Rfl:    magnesium oxide (MAG-OX) 400 (240 Mg) MG tablet, Take 1 tablet by mouth 4 (four) times daily., Disp: , Rfl:    metFORMIN (GLUCOPHAGE) 500 MG tablet, Take 1,000 mg by mouth 2 (two) times daily with a meal., Disp: , Rfl:    omeprazole (PRILOSEC) 40 MG capsule, Take 40 mg by mouth daily., Disp: , Rfl:    ondansetron (ZOFRAN) 4 MG tablet, Take 1 tablet (4 mg total) by mouth every 6 (six) hours as needed for nausea., Disp: 20 tablet, Rfl: 0   traMADol (ULTRAM) 50 MG tablet, Take 1 tablet (50 mg total) by mouth every 6 (six) hours as  needed for moderate pain., Disp: 30 tablet, Rfl: 0   traZODone (DESYREL) 50 MG tablet, Take 50 mg by mouth at bedtime., Disp: , Rfl:    triamterene-hydrochlorothiazide (DYAZIDE) 37.5-25 MG capsule, Take 1 capsule by mouth daily., Disp: , Rfl:    valsartan (DIOVAN) 320 MG tablet, Take 320 mg by mouth daily., Disp: , Rfl:    Social History: Social History   Tobacco Use   Smoking status: Never   Smokeless tobacco: Never  Substance Use Topics   Alcohol use: No   Drug use: No    Family Medical History: Family History  Problem Relation Age of Onset   Diabetes Mother    Stroke Mother    Heart disease Father    Prostate cancer Brother    Ovarian cancer Sister        female   Colon cancer Neg Hx    Liver disease Neg Hx     Physical Examination: Vitals:   08/31/23 1245 08/31/23 1445  BP:  (!) 158/79  Pulse: 87 92  Resp: 14 (!) 26  Temp:    SpO2: 100% 100%     General: Patient is well developed, well nourished, calm, collected, and in no apparent distress.  NEUROLOGICAL:  General: In no acute distress.   Awake, alert, oriented to person, place, and day, year, states that it is August but then corrects to September..  Pupils equal round and reactive to light approximately half millimeter anisocoria right greater than left, likely postsurgical, patient status post bilateral cataract surgery..  Full Facial tone is symmetric.  Tongue protrusion is midline.  Bilateral upper extremities are full strength proximally and distally.  There is no pronator drift.  Language is conversant.  GCS:15   Bilateral upper and lower extremity sensation is intact to light touch.  Imaging: Narrative & Impression  CLINICAL DATA:  Acute onset of dizziness today with intracranial hemorrhage shown on prior CT   EXAM: CT ANGIOGRAPHY HEAD AND NECK WITH AND WITHOUT CONTRAST   TECHNIQUE: Multidetector CT imaging of the head and neck was performed using the standard protocol during bolus  administration of intravenous contrast. Multiplanar CT image reconstructions and MIPs were obtained to evaluate the vascular anatomy. Carotid stenosis measurements (when applicable) are obtained utilizing NASCET criteria, using the distal internal carotid diameter as the denominator.   RADIATION DOSE REDUCTION: This exam was performed according to the departmental dose-optimization program which includes automated exposure control, adjustment of the mA and/or kV according to patient size and/or use of iterative reconstruction technique.   CONTRAST:  75mL OMNIPAQUE IOHEXOL 350 MG/ML SOLN   COMPARISON:  Head CT earlier same day   FINDINGS: CTA NECK FINDINGS   Aortic arch: Aortic atherosclerosis.  Branching pattern is normal.  Right carotid system: Common carotid artery widely patent to the bifurcation. Calcified plaque at the carotid bifurcation and ICA bulb but no stenosis when compared to the diameter of the more distal cervical ICA. The ICA is tortuous just beneath the skull base but there is no stenosis or pseudo aneurysm.   Left carotid system: Common carotid artery widely patent to the bifurcation. Calcified plaque at the bifurcation but no stenosis. Cervical ICA is normal.   Vertebral arteries: The right vertebral artery origin is widely patent. There is dense calcified plaque adjacent to the left vertebral artery origin but without visible stenosis. Both vessels are widely patent through the cervical region to the foramen magnum.   Skeleton: Ordinary cervical spondylosis.   Other neck: No mass or lymphadenopathy.   Upper chest: Mild pleural and parenchymal scarring in the anterior right chest.   Review of the MIP images confirms the above findings   CTA HEAD FINDINGS   Anterior circulation: Both internal carotid arteries are patent through the skull base and siphon regions. Siphon atherosclerotic calcification but no stenosis greater than 30%. The anterior  and middle cerebral vessels are patent. No large vessel occlusion or proximal stenosis. 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   Posterior circulation: Both vertebral arteries are widely patent through the foramen magnum to the basilar artery. No basilar stenosis. Posterior circulation branch vessels are patent. Left PCA receives most of it supply from the anterior circulation.   Venous sinuses: Patent and normal.   Anatomic variants: None significant.   Review of the MIP images confirms the above findings   IMPRESSION: 1. 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 This would not seem to relate to the traumatic pattern intracranial hemorrhage described earlier. 2. No intracranial large vessel occlusion or correctable proximal stenosis. 3. Aortic atherosclerosis. 4. Atherosclerotic change at both carotid bifurcations but without stenosis.   Aortic Atherosclerosis (ICD10-I70.0).     Electronically Signed   By: Paulina Fusi M.D.   On: 08/31/2023 14:44    Study Result  Narrative & Impression  CLINICAL DATA:  82 year old female with dizziness this morning.   EXAM: CT HEAD WITHOUT CONTRAST   TECHNIQUE: Contiguous axial images were obtained from the base of the skull through the vertex without intravenous contrast.   RADIATION DOSE REDUCTION: This exam was performed according to the departmental dose-optimization program which includes automated exposure control, adjustment of the mA and/or kV according to patient size and/or use of iterative reconstruction technique.   COMPARISON:  None Available.   FINDINGS: Brain: Cerebral volume is within normal limits for age.   There are scattered small hyperdense hemorrhagic foci in the right hemisphere (series 2, image 22, series 2, image 15 and coronal image 34) which are in the right subdural and possibly also right subarachnoid space. Overall  right side subdural space predominantly low-density enlargement is up to 3 mm.   There is no subsequent intracranial mass effect or midline shift. No intraventricular hemorrhage or ventriculomegaly. However, there does appear to be a similar small left-side superior convexity low-density subdural collection on coronal image 26, 2-3 mm. Basilar cisterns appear normal and no other evidence of subarachnoid blood identified.   No superimposed No cortically based acute infarct identified. Gray-white differentiation is within normal limits for age.   Vascular: No suspicious intracranial vascular hyperdensity. Calcified atherosclerosis at the skull base.   Skull: No acute osseous abnormality identified.   Sinuses/Orbits: Minor mucosal thickening  and bubbly opacity in the left sphenoid sinus. Other visualized paranasal sinuses and mastoids are well aerated. Tympanic cavities appear clear.   Other: No acute orbit or scalp soft tissue finding.   IMPRESSION: 1. Positive for small, predominantly low-density Subdural Hematomas over the superior convexities, 2-3 mm on each side. And a superimposed small volume of right hemisphere Subarachnoid Blood (sagittal image 12).   2. No associated intracranial mass effect or midline shift. No other acute intracranial abnormality.   Electronically Signed: By: Odessa Fleming M.D. On: 08/31/2023 12:48        I have personally reviewed the images and agree with the above interpretation.  Labs:  Results for orders placed or performed during the hospital encounter of 08/31/23 (from the past 72 hour(s))  Comprehensive metabolic panel     Status: Abnormal   Collection Time: 08/31/23 11:46 AM  Result Value Ref Range   Sodium 131 (L) 135 - 145 mmol/L   Potassium 4.2 3.5 - 5.1 mmol/L   Chloride 97 (L) 98 - 111 mmol/L   CO2 19 (L) 22 - 32 mmol/L   Glucose, Bld 126 (H) 70 - 99 mg/dL    Comment: Glucose reference range applies only to samples taken after  fasting for at least 8 hours.   BUN 20 8 - 23 mg/dL   Creatinine, Ser 1.19 0.44 - 1.00 mg/dL   Calcium 9.1 8.9 - 14.7 mg/dL   Total Protein 6.6 6.5 - 8.1 g/dL   Albumin 3.8 3.5 - 5.0 g/dL   AST 26 15 - 41 U/L   ALT 12 0 - 44 U/L   Alkaline Phosphatase 56 38 - 126 U/L   Total Bilirubin 1.0 0.3 - 1.2 mg/dL   GFR, Estimated >82 >95 mL/min    Comment: (NOTE) Calculated using the CKD-EPI Creatinine Equation (2021)    Anion gap 15 5 - 15    Comment: Performed at [facility], [address] [place], [place] [address]  Troponin I (High Sensitivity)     Status: None   Collection Time: 08/31/23 11:46 AM  Result Value Ref Range   Troponin I (High Sensitivity) 5 <18 ng/L    Comment: (NOTE) Elevated high sensitivity troponin I (hsTnI) values and significant  changes across serial measurements may suggest ACS but many other  chronic and acute conditions are known to elevate hsTnI results.  Refer to the "Links" section for chest pain algorithms and additional  guidance. Performed at [facility], [address] [place], [place] [address]   CBC with Differential     Status: Abnormal   Collection Time: 08/31/23 11:46 AM  Result Value Ref Range   WBC 9.7 4.0 - 10.5 K/uL   RBC 4.18 3.87 - 5.11 MIL/uL   Hemoglobin 12.2 12.0 - 15.0 g/dL   HCT 46.9 62.9 - 52.8 %   MCV 90.7 80.0 - 100.0 fL   MCH 29.2 26.0 - 34.0 pg   MCHC 32.2 30.0 - 36.0 g/dL   RDW 41.3 24.4 - 01.0 %   Platelets 260 150 - 400 K/uL   nRBC 0.0 0.0 - 0.2 %   Neutrophils Relative % 82 %   Neutro Abs 7.9 (H) 1.7 - 7.7 K/uL   Lymphocytes Relative 12 %   Lymphs Abs 1.2 0.7 - 4.0 K/uL   Monocytes Relative 5 %   Monocytes Absolute 0.5 0.1 - 1.0 K/uL   Eosinophils Relative 0 %   Eosinophils Absolute 0.0 0.0 - 0.5 K/uL  Basophils Relative 1 %   Basophils Absolute 0.1 0.0 - 0.1 K/uL   Immature Granulocytes 0 %   Abs Immature Granulocytes 0.03 0.00 - 0.07 K/uL    Comment: Performed at [facility],  [address] [place], [place] [address]  Troponin I (High Sensitivity)     Status: None   Collection Time: 08/31/23  1:40 PM  Result Value Ref Range   Troponin I (High Sensitivity) 7 <18 ng/L    Comment: (NOTE) Elevated high sensitivity troponin I (hsTnI) values and significant  changes across serial measurements may suggest ACS but many other  chronic and acute conditions are known to elevate hsTnI results.  Refer to the "Links" section for chest pain algorithms and additional  guidance. Performed at [facility], [address], [place], [place] [address]        Assessment and Plan: Brenda Landry is a pleasant 82 y.o. female with history of hypertension and diabetes who is recently being treated for a urinary tract infection who was in her usual state of health until earlier this afternoon she became lightheaded and passed out for approximately 1 minute.  This was not preceded by a thunderclap headache.  She is not currently complaining of any severe headache or neck pain or stiffness.  She is not having any other neurologic complaints.  No history of aneurysm.  No history of smoking.  No family history of aneurysm.  On physical examination she is GCS 15 she has no pronator drift, otherwise no localizing symptoms on her examination.  She has a supple neck without loss of range of motion.  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  This case was discussed with the neurovascular team at [facility], they agreed that this was not likely the cause of her syncope as her bleeding pattern does not appear to be aneurysmal in origin.  There is no cisternal blood.  No hydrocephalus.  Patient has no prodrome or current  clinical symptoms consistent with a subarachnoid hemorrhage secondary to aneurysm rupture.  This is most likely traumatic in nature.  She should have a repeat head CT 6 hours from the first to evaluate for stability.  At that time she can be cleared from a neurosurgical standpoint for any urgent intervention.  Referral has been made for aneurysm follow-up at [facility].  In regards to her traumatic subarachnoid hemorrhage, we will plan on giving her 1 week of Keppra, 500 twice daily.  We discussed the side effects of this medication and the family is aware.  From a neurosurgical surgical standpoint if the repeat head CT is stable, she could be discharged with follow-up.  Will defer the rest of the syncopal workup to our emergency and medicine colleagues.   Lovenia Kim, MD/MSCR Dept. of Neurosurgery

## 2023-08-31 NOTE — ED Notes (Signed)
Pt ambulated to the bathroom with steady gait. RN did assist pt as safety precaution, although pt denied any dizziness and did not need assistance.

## 2023-09-01 ENCOUNTER — Inpatient Hospital Stay: Payer: Medicare HMO

## 2023-09-01 DIAGNOSIS — S065X1A Traumatic subdural hemorrhage with loss of consciousness of 30 minutes or less, initial encounter: Secondary | ICD-10-CM | POA: Diagnosis not present

## 2023-09-01 DIAGNOSIS — I609 Nontraumatic subarachnoid hemorrhage, unspecified: Secondary | ICD-10-CM | POA: Diagnosis not present

## 2023-09-01 DIAGNOSIS — S066X9A Traumatic subarachnoid hemorrhage with loss of consciousness of unspecified duration, initial encounter: Secondary | ICD-10-CM | POA: Diagnosis not present

## 2023-09-01 DIAGNOSIS — S065XAA Traumatic subdural hemorrhage with loss of consciousness status unknown, initial encounter: Secondary | ICD-10-CM | POA: Diagnosis present

## 2023-09-01 DIAGNOSIS — S066X1A Traumatic subarachnoid hemorrhage with loss of consciousness of 30 minutes or less, initial encounter: Secondary | ICD-10-CM | POA: Diagnosis not present

## 2023-09-01 DIAGNOSIS — I629 Nontraumatic intracranial hemorrhage, unspecified: Secondary | ICD-10-CM | POA: Diagnosis not present

## 2023-09-01 MED ORDER — LEVETIRACETAM 500 MG PO TABS: 500.0000 mg | ORAL_TABLET | Freq: Two times a day (BID) | ORAL | 0 refills | Status: DC

## 2023-09-01 MED ORDER — LEVETIRACETAM 500 MG PO TABS: 500.0000 mg | ORAL_TABLET | Freq: Two times a day (BID) | ORAL | 0 refills | Status: AC

## 2023-09-01 NOTE — ED Notes (Signed)
Pt assisted to bathroom, tolerated well

## 2023-09-01 NOTE — Care Management CC44 (Signed)
Condition Code 44 Documentation Completed  Patient Details  Name: HULDA REDDIX MRN: [id number] Date of Birth: 1941-09-18   Condition Code 44 given:  Yes Patient signature on Condition Code 44 notice:  Yes Documentation of 2 MD's agreement:  Yes Code 44 added to claim:  Yes    Ceniya Fowers E Elorah Dewing, LCSW 09/01/2023, 10:37 AM

## 2023-09-01 NOTE — ED Notes (Signed)
Pt denies h/a, but states she has some dizziness.

## 2023-09-01 NOTE — Discharge Summary (Signed)
Physician Discharge Summary   Brenda Landry  female DOB: [birth year]  ZOX:[id number]  PCP: Marguarite Arbour, MD  Admit date: 08/31/2023 Discharge date: 09/01/2023  Admitted From: home Disposition:  home CODE STATUS: Full code   Hospital Course:  For full details, please see H&P, progress notes, consult notes and ancillary notes.  Briefly,  Brenda Landry is a 82 y.o. female with medical history significant for diabetes mellitus type 2, hypertension, coming via EMS for dizziness/near syncope.  Per report patient was walking in the morning she returned home had an episode of dizziness.  she was helped down into the chair and did not fall or strike her head.  Patient did lose consciousness and within 1 minute was awake again and was mildly confused.   Pt was found to have traumatic subarachnoid hemorrhage with chronic subdural hematomas.  Neurosurgery consulted.  Chronic subdural hematomas Traumatic subarachnoid hemorrhage --CT head showed bilateral subdural hematomas overlying the bilateral cerebral convexities and small volume subarachnoid hemorrhage overlying the right cerebral convexity.   --No neurological deficit.  Repeat CT head stable.  Pt was cleared for discharge by neurosurgery and will f/u with Dr. Ernestine Mcmurray as outpatient with repeat scan in 3-4 weeks.   --pt was discharged on 1 week of Keppra for seizure ppx.  Syncope: --possibly due to subarachnoid hemorrhage.   Cerebral artery aneurysm  --CTA head/neck found 2 x 3 mm anterior communicating artery aneurysm with wide mouth communication. --Referral has been made for aneurysm follow-up at [facility].    HTN: --cont home Dyazide and valsartan   DMII: --recent A1c 6.1, well controlled.    Unless noted above, medications under "STOP" list are ones pt was not taking PTA.  Discharge Diagnoses:  Principal Problem:   Traumatic subarachnoid hematoma with loss of consciousness (HCC) Active Problems:   Anterior  communicating artery aneurysm   Chronic subdural hematoma (HCC)   HTN (hypertension)   DM (diabetes mellitus) (HCC)   Subarachnoid hemorrhage (HCC)   Subdural hematoma (HCC)   30 Day Unplanned Readmission Risk Score    Flowsheet Row ED from 08/31/2023 in [facility] Emergency Department at [facility]  30 Day Unplanned Readmission Risk Score (%) 8.79 Filed at 09/01/2023 0801       This score is the patient's risk of an unplanned readmission within 30 days of being discharged (0 -100%). The score is based on dignosis, age, lab data, medications, orders, and past utilization.   Low:  0-14.9   Medium: 15-21.9   High: 22-29.9   Extreme: 30 and above         Discharge Instructions:  Allergies as of 09/01/2023       Reactions   Codeine Itching        Medication List     STOP taking these medications    docusate sodium 100 MG capsule Commonly known as: COLACE   ondansetron 4 MG tablet Commonly known as: ZOFRAN   traMADol 50 MG tablet Commonly known as: ULTRAM       TAKE these medications    acetaminophen 500 MG tablet Commonly known as: TYLENOL Take 1,000 mg by mouth every 6 (six) hours as needed for mild pain or moderate pain.   aspirin EC 81 MG tablet Take 81 mg by mouth daily.   atorvastatin 40 MG tablet Commonly known as: LIPITOR Take 40 mg by mouth daily.   ciprofloxacin 500 MG tablet Commonly known as: CIPRO Take 500 mg by mouth 2 (two) times daily.  imipramine 25 MG tablet Commonly known as: TOFRANIL Take 25 mg by mouth at bedtime.   levETIRAcetam 500 MG tablet Commonly known as: KEPPRA Take 1 tablet (500 mg total) by mouth 2 (two) times daily for 7 days.   metFORMIN 500 MG tablet Commonly known as: GLUCOPHAGE Take 1,000 mg by mouth 2 (two) times daily with a meal.   omeprazole 40 MG capsule Commonly known as: PRILOSEC Take 40 mg by mouth daily.   traZODone 50 MG tablet Commonly known as: DESYREL Take 50 mg by mouth at  bedtime.   triamterene-hydrochlorothiazide 37.5-25 MG capsule Commonly known as: DYAZIDE Take 1 capsule by mouth daily.   valsartan 320 MG tablet Commonly known as: DIOVAN Take 320 mg by mouth daily.         Follow-up Information     Lovenia Kim, MD Follow up in 3 week(s).   Specialty: Neurosurgery Contact information: [address] [place] [address] [phone]                 Allergies  Allergen Reactions   Codeine Itching     The results of significant diagnostics from this hospitalization (including imaging, microbiology, ancillary and laboratory) are listed below for reference.   Consultations:   Procedures/Studies: CT HEAD WO CONTRAST ( )  Result Date: 09/01/2023 CLINICAL DATA:  Follow-up examination for intracranial hemorrhage. EXAM: CT HEAD WITHOUT CONTRAST TECHNIQUE: Contiguous axial images were obtained from the base of the skull through the vertex without intravenous contrast. RADIATION DOSE REDUCTION: This exam was performed according to the departmental dose-optimization program which includes automated exposure control, adjustment of the mA and/or kV according to patient size and/or use of iterative reconstruction technique. COMPARISON:  Prior CT from 08/31/2023. FINDINGS: Brain: Previously identified subdural hematomas overlying the bilateral cerebral convexities again seen. Paradoxically, the hematomas appear overall decreased in size from most recent exam, now r measuring up to approximately 4-5 mm bilaterally. Overall appearance is similar as compared to prior CT from 08/31/2023. The apparent interval increase in size may have been artifactual and/or transient. Persistent small volume subarachnoid hemorrhage seen overlying the right cerebral convexity, little interval change. No other new acute intracranial hemorrhage. No acute large vessel territory infarct. No mass lesion or midline shift. No hydrocephalus. Vascular: No  abnormal hyperdense vessel. Calcified atherosclerosis present at the skull base. Skull: Scalp soft tissues and calvarium demonstrate no new finding. Sinuses/Orbits: Globes and orbital soft tissues within normal limits mild scattered mucosal thickening present about the ethmoidal air cells. Paranasal sinuses are otherwise clear. No mastoid effusion. Other: None. IMPRESSION: 1. Paradoxically decreased size of bilateral subdural hematomas overlying the bilateral cerebral convexities, now measuring up to approximately 4-5 mm bilaterally. Overall appearance is similar as compared to prior CT from 08/31/2023. The apparent interval increase in size seen on most recent CT may have been artifactual and/or transient. 2. Persistent small volume subarachnoid hemorrhage overlying the right cerebral convexity, stable. 3. No other new acute intracranial abnormality. Electronically Signed   By: Rise Mu M.D.   On: 09/01/2023 03:11   CT Head Wo Contrast  Result Date: 08/31/2023 CLINICAL DATA:  Follow-up examination for intracranial hemorrhage, subdural hematomas on CT exam from earlier the same day. EXAM: CT HEAD WITHOUT CONTRAST TECHNIQUE: Contiguous axial images were obtained from the base of the skull through the vertex without intravenous contrast. RADIATION DOSE REDUCTION: This exam was performed according to the departmental dose-optimization program which includes automated exposure control, adjustment of the mA and/or  kV according to patient size and/or use of iterative reconstruction technique. COMPARISON:  Prior CT from earlier the same day. FINDINGS: Brain: Previously identified subdural hematomas have increased in size, now measuring up to 11 mm on the left and 13 mm on the right at the level of the parietal convexities. Collections demonstrate intermediate density. Superimposed small volume acute subarachnoid hemorrhage at the right parietal convexity again noted, relatively stable from prior. No  significant mass effect or midline shift. No other new acute intracranial hemorrhage. No acute large vessel territory infarct. No mass lesion or hydrocephalus. Vascular: Some contrast material remains on board from prior CTA performed earlier the same day. Scattered calcified atherosclerosis present at the skull base. Skull: Scalp soft tissues demonstrate no new finding. Calvarium intact. Sinuses/Orbits: Globes and orbital soft tissues within normal limits. Paranasal sinuses remain largely clear. No mastoid effusion. Other: None. IMPRESSION: 1. Interval increase in size of bilateral subdural hematomas, now measuring up to 11 mm on the left and 13 mm on the right at the level of the parietal convexities. No significant mass effect or midline shift. 2. Superimposed small volume acute subarachnoid hemorrhage at the right parietal convexity, relatively stable from prior. 3. No other new acute intracranial abnormality. Electronically Signed   By: Rise Mu M.D.   On: 08/31/2023 19:44   CT ANGIO HEAD NECK W WO CM  Result Date: 08/31/2023 CLINICAL DATA:  Acute onset of dizziness today with intracranial hemorrhage shown on prior CT EXAM: CT ANGIOGRAPHY HEAD AND NECK WITH AND WITHOUT CONTRAST TECHNIQUE: Multidetector CT imaging of the head and neck was performed using the standard protocol during bolus administration of intravenous contrast. Multiplanar CT image reconstructions and MIPs were obtained to evaluate the vascular anatomy. Carotid stenosis measurements (when applicable) are obtained utilizing NASCET criteria, using the distal internal carotid diameter as the denominator. RADIATION DOSE REDUCTION: This exam was performed according to the departmental dose-optimization program which includes automated exposure control, adjustment of the mA and/or kV according to patient size and/or use of iterative reconstruction technique. CONTRAST:  75mL OMNIPAQUE IOHEXOL 350 MG/ML SOLN COMPARISON:  Head CT earlier  same day FINDINGS: CTA NECK FINDINGS Aortic arch: Aortic atherosclerosis.  Branching pattern is normal. Right carotid system: Common carotid artery widely patent to the bifurcation. Calcified plaque at the carotid bifurcation and ICA bulb but no stenosis when compared to the diameter of the more distal cervical ICA. The ICA is tortuous just beneath the skull base but there is no stenosis or pseudo aneurysm. Left carotid system: Common carotid artery widely patent to the bifurcation. Calcified plaque at the bifurcation but no stenosis. Cervical ICA is normal. Vertebral arteries: The right vertebral artery origin is widely patent. There is dense calcified plaque adjacent to the left vertebral artery origin but without visible stenosis. Both vessels are widely patent through the cervical region to the foramen magnum. Skeleton: Ordinary cervical spondylosis. Other neck: No mass or lymphadenopathy. Upper chest: Mild pleural and parenchymal scarring in the anterior right chest. Review of the MIP images confirms the above findings CTA HEAD FINDINGS Anterior circulation: Both internal carotid arteries are patent through the skull base and siphon regions. Siphon atherosclerotic calcification but no stenosis greater than 30%. The anterior and middle cerebral vessels are patent. No large vessel occlusion or proximal stenosis. 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 Posterior circulation: Both vertebral arteries are widely patent through the foramen magnum to the basilar artery. No basilar stenosis. Posterior circulation branch  vessels are patent. Left PCA receives most of it supply from the anterior circulation. Venous sinuses: Patent and normal. Anatomic variants: None significant. Review of the MIP images confirms the above findings IMPRESSION: 1. 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 This would not seem to relate to the traumatic pattern  intracranial hemorrhage described earlier. 2. No intracranial large vessel occlusion or correctable proximal stenosis. 3. Aortic atherosclerosis. 4. Atherosclerotic change at both carotid bifurcations but without stenosis. Aortic Atherosclerosis (ICD10-I70.0). Electronically Signed   By: Paulina Fusi M.D.   On: 08/31/2023 14:44   CT Head Wo Contrast  Addendum Date: 08/31/2023   ADDENDUM REPORT: 08/31/2023 13:23 ADDENDUM: Study discussed by telephone with Dr. Donna Bernard on 08/31/2023 at 1256 hours. Electronically Signed   By: Odessa Fleming M.D.   On: 08/31/2023 13:23   Result Date: 08/31/2023 CLINICAL DATA:  82 year old female with dizziness this morning. EXAM: CT HEAD WITHOUT CONTRAST TECHNIQUE: Contiguous axial images were obtained from the base of the skull through the vertex without intravenous contrast. RADIATION DOSE REDUCTION: This exam was performed according to the departmental dose-optimization program which includes automated exposure control, adjustment of the mA and/or kV according to patient size and/or use of iterative reconstruction technique. COMPARISON:  None Available. FINDINGS: Brain: Cerebral volume is within normal limits for age. There are scattered small hyperdense hemorrhagic foci in the right hemisphere (series 2, image 22, series 2, image 15 and coronal image 34) which are in the right subdural and possibly also right subarachnoid space. Overall right side subdural space predominantly low-density enlargement is up to 3 mm. There is no subsequent intracranial mass effect or midline shift. No intraventricular hemorrhage or ventriculomegaly. However, there does appear to be a similar small left-side superior convexity low-density subdural collection on coronal image 26, 2-3 mm. Basilar cisterns appear normal and no other evidence of subarachnoid blood identified. No superimposed No cortically based acute infarct identified. Gray-white differentiation is within normal limits for age. Vascular:  No suspicious intracranial vascular hyperdensity. Calcified atherosclerosis at the skull base. Skull: No acute osseous abnormality identified. Sinuses/Orbits: Minor mucosal thickening and bubbly opacity in the left sphenoid sinus. Other visualized paranasal sinuses and mastoids are well aerated. Tympanic cavities appear clear. Other: No acute orbit or scalp soft tissue finding. IMPRESSION: 1. Positive for small, predominantly low-density Subdural Hematomas over the superior convexities, 2-3 mm on each side. And a superimposed small volume of right hemisphere Subarachnoid Blood (sagittal image 12). 2. No associated intracranial mass effect or midline shift. No other acute intracranial abnormality. Electronically Signed: By: Odessa Fleming M.D. On: 08/31/2023 12:48   DG Chest Port 1 View  Result Date: 08/31/2023 CLINICAL DATA:  Syncope, dizziness EXAM: PORTABLE CHEST - 1 VIEW COMPARISON:  04/26/2006 FINDINGS: Relatively low lung volumes. Scattered patchy opacities at the right lung base. Heart size and mediastinal contours are within normal limits. Aortic Atherosclerosis (ICD10-170.0). No effusion. Visualized bones unremarkable. IMPRESSION: Patchy right basilar opacities, possibly atelectasis. Electronically Signed   By: Corlis Leak M.D.   On: 08/31/2023 12:24      Labs: BNP (last 3 results) No results for input(s): "BNP" in the last 8760 hours. Basic Metabolic Panel: Recent Labs  Lab 08/31/23 1146  NA 131*  K 4.2  CL 97*  CO2 19*  GLUCOSE 126*  BUN 20  CREATININE 0.89  CALCIUM 9.1  MG 1.9   Liver Function Tests: Recent Labs  Lab 08/31/23 1146  AST 26  ALT 12  ALKPHOS 56  BILITOT 1.0  PROT 6.6  ALBUMIN 3.8   No results for input(s): "LIPASE", "AMYLASE" in the last 168 hours. No results for input(s): "AMMONIA" in the last 168 hours. CBC: Recent Labs  Lab 08/31/23 1146  WBC 9.7  NEUTROABS 7.9*  HGB 12.2  HCT 37.9  MCV 90.7  PLT 260   Cardiac Enzymes: No results for input(s):  "CKTOTAL", "CKMB", "CKMBINDEX", "TROPONINI" in the last 168 hours. BNP: Invalid input(s): "POCBNP" CBG: No results for input(s): "GLUCAP" in the last 168 hours. D-Dimer No results for input(s): "DDIMER" in the last 72 hours. Hgb A1c No results for input(s): "HGBA1C" in the last 72 hours. Lipid Profile No results for input(s): "CHOL", "HDL", "LDLCALC", "TRIG", "CHOLHDL", "LDLDIRECT" in the last 72 hours. Thyroid function studies No results for input(s): "TSH", "T4TOTAL", "T3FREE", "THYROIDAB" in the last 72 hours.  Invalid input(s): "FREET3" Anemia work up No results for input(s): "VITAMINB12", "FOLATE", "FERRITIN", "TIBC", "IRON", "RETICCTPCT" in the last 72 hours. Urinalysis    Component Value Date/Time   COLORURINE YELLOW (A) 01/21/2023 0955   APPEARANCEUR CLEAR (A) 01/21/2023 0955   LABSPEC 1.014 01/21/2023 0955   PHURINE 7.0 01/21/2023 0955   GLUCOSEU NEGATIVE 01/21/2023 0955   HGBUR NEGATIVE 01/21/2023 0955   BILIRUBINUR NEGATIVE 01/21/2023 0955   KETONESUR 5 (A) 01/21/2023 0955   PROTEINUR NEGATIVE 01/21/2023 0955   UROBILINOGEN 0.2 02/27/2010 1002   NITRITE NEGATIVE 01/21/2023 0955   LEUKOCYTESUR MODERATE (A) 01/21/2023 0955   Sepsis Labs Recent Labs  Lab 08/31/23 1146  WBC 9.7   Microbiology No results found for this or any previous visit (from the past 240 hour(s)).   Total time spend on discharging this patient, including the last patient exam, discussing the hospital stay, instructions for ongoing care as it relates to all pertinent caregivers, as well as preparing the medical discharge records, prescriptions, and/or referrals as applicable, is 40 minutes.    Darlin Priestly, MD  [facility] 09/01/2023, 10:46 AM

## 2023-09-01 NOTE — Progress Notes (Signed)
Attending Progress Note  History: Brenda Landry is here for traumatic subarachnoid hemorrhage with chronic subdural hematomas.  Overnight her repeat CT scan demonstrated some increased hyperdensity, concern was contrast extravasation versus subdural hematoma maturation.  Given the appearance and unchanged exam we favored contrast extravasation so asked for repeat head CT in the morning.  This was performed and demonstrated resolution of the hyperdensity.  Physical Exam: Vitals:   09/01/23 0800 09/01/23 0900  BP: 122/74 135/76  Pulse: 77 87  Resp: 18 (!) 24  Temp:    SpO2: 99% 98%    AA Ox3 CNI  Strength: No evidence of pronator drift, nonlocalizable examination.  Data:  Recent Labs  Lab 08/31/23 1146  NA 131*  K 4.2  CL 97*  CO2 19*  BUN 20  CREATININE 0.89  GLUCOSE 126*  CALCIUM 9.1   Recent Labs  Lab 08/31/23 1146  AST 26  ALT 12  ALKPHOS 56     Recent Labs  Lab 08/31/23 1146  WBC 9.7  HGB 12.2  HCT 37.9  PLT 260   No results for input(s): "APTT", "INR" in the last 168 hours.       Other tests/results:  Narrative & Impression  CLINICAL DATA:  Follow-up examination for intracranial hemorrhage.   EXAM: CT HEAD WITHOUT CONTRAST   TECHNIQUE: Contiguous axial images were obtained from the base of the skull through the vertex without intravenous contrast.   RADIATION DOSE REDUCTION: This exam was performed according to the departmental dose-optimization program which includes automated exposure control, adjustment of the mA and/or kV according to patient size and/or use of iterative reconstruction technique.   COMPARISON:  Prior CT from 08/31/2023.   FINDINGS: Brain: Previously identified subdural hematomas overlying the bilateral cerebral convexities again seen. Paradoxically, the hematomas appear overall decreased in size from most recent exam, now r measuring up to approximately 4-5 mm bilaterally. Overall appearance is similar as compared to  prior CT from 08/31/2023. The apparent interval increase in size may have been artifactual and/or transient. Persistent small volume subarachnoid hemorrhage seen overlying the right cerebral convexity, little interval change.   No other new acute intracranial hemorrhage. No acute large vessel territory infarct. No mass lesion or midline shift. No hydrocephalus.   Vascular: No abnormal hyperdense vessel. Calcified atherosclerosis present at the skull base.   Skull: Scalp soft tissues and calvarium demonstrate no new finding.   Sinuses/Orbits: Globes and orbital soft tissues within normal limits mild scattered mucosal thickening present about the ethmoidal air cells. Paranasal sinuses are otherwise clear. No mastoid effusion.   Other: None.   IMPRESSION: 1. Paradoxically decreased size of bilateral subdural hematomas overlying the bilateral cerebral convexities, now measuring up to approximately 4-5 mm bilaterally. Overall appearance is similar as compared to prior CT from 08/31/2023. The apparent interval increase in size seen on most recent CT may have been artifactual and/or transient. 2. Persistent small volume subarachnoid hemorrhage overlying the right cerebral convexity, stable. 3. No other new acute intracranial abnormality.     Electronically Signed   By: Rise Mu M.D.   On: 09/01/2023 03:11      Assessment/Plan:  Brenda Landry is an 82 year old woman who is presented with a traumatic subarachnoid hemorrhage with chronic subdural hematomas.  There is some concern for expansion, however the repeat scan was taken after a contrast load.  Concern was that there was contrast extravasation in the chronic subdural membranes so repeat CT was ordered.  CT demonstrated resolution of the  hyperdensity further supporting that theory.  Physical examination is stable with no localizable deficits.  - mobilize -Keppra for 1 week for the traumatic subarachnoid  hemorrhage -Will follow as an outpatient for her subdural hematomas, these appear chronic and we will follow with a repeat scan in 3 to 4 weeks.  Lovenia Kim, MD/MSCR Department of Neurosurgery

## 2023-09-02 ENCOUNTER — Other Ambulatory Visit: Payer: Self-pay | Admitting: Neurosurgery

## 2023-09-02 ENCOUNTER — Telehealth: Payer: Self-pay | Admitting: Neurosurgery

## 2023-09-02 DIAGNOSIS — I6203 Nontraumatic chronic subdural hemorrhage: Secondary | ICD-10-CM

## 2023-09-02 NOTE — Telephone Encounter (Signed)
Subarachnoid hemorrhage 08/31/2023 Will she need a CT before seeing Dr.Smith? The daughter states that patient is complaining of headaches and dizziness. Is there anything that the patient should do? Also Olegario Messier is asking for the name and phone number of the [facility] doctor that was mentioned.

## 2023-09-02 NOTE — Telephone Encounter (Signed)
Called and spoke with Brenda Landry advised that Dr Katrinka Blazing would like a CT of head before the appointment and it is okay to take tylenol for headaches  And the name and phone number os the [facility] doctor is Marliss Czar [phone]

## 2023-09-02 NOTE — Telephone Encounter (Signed)
Yes repeat head CT. Karie Mainland Zomorodi is the Careers adviser at [employer]

## 2023-09-13 ENCOUNTER — Ambulatory Visit
Admission: RE | Admit: 2023-09-13 | Discharge: 2023-09-13 | Disposition: A | Discharge status: Home / Self Care | Payer: Medicare HMO | Source: Ambulatory Visit | Attending: Internal Medicine | Admitting: Internal Medicine

## 2023-09-13 ENCOUNTER — Other Ambulatory Visit: Payer: Self-pay | Admitting: Internal Medicine

## 2023-09-13 ENCOUNTER — Telehealth: Payer: Self-pay

## 2023-09-13 ENCOUNTER — Encounter: Payer: Self-pay | Admitting: Emergency Medicine

## 2023-09-13 ENCOUNTER — Emergency Department
Admission: EM | Admit: 2023-09-13 | Discharge: 2023-09-13 | Disposition: A | Discharge status: Home / Self Care | Payer: Medicare HMO | Attending: Emergency Medicine | Admitting: Emergency Medicine

## 2023-09-13 ENCOUNTER — Other Ambulatory Visit: Payer: Self-pay

## 2023-09-13 ENCOUNTER — Emergency Department: Payer: Medicare HMO

## 2023-09-13 DIAGNOSIS — W19XXXA Unspecified fall, initial encounter: Secondary | ICD-10-CM | POA: Insufficient documentation

## 2023-09-13 DIAGNOSIS — S0990XA Unspecified injury of head, initial encounter: Secondary | ICD-10-CM | POA: Diagnosis present

## 2023-09-13 DIAGNOSIS — S065X0A Traumatic subdural hemorrhage without loss of consciousness, initial encounter: Secondary | ICD-10-CM | POA: Diagnosis not present

## 2023-09-13 DIAGNOSIS — I639 Cerebral infarction, unspecified: Secondary | ICD-10-CM | POA: Diagnosis not present

## 2023-09-13 DIAGNOSIS — R42 Dizziness and giddiness: Secondary | ICD-10-CM | POA: Insufficient documentation

## 2023-09-13 DIAGNOSIS — R110 Nausea: Secondary | ICD-10-CM | POA: Diagnosis not present

## 2023-09-13 DIAGNOSIS — Z8679 Personal history of other diseases of the circulatory system: Secondary | ICD-10-CM | POA: Diagnosis not present

## 2023-09-13 DIAGNOSIS — R519 Headache, unspecified: Secondary | ICD-10-CM

## 2023-09-13 DIAGNOSIS — Z79899 Other long term (current) drug therapy: Secondary | ICD-10-CM | POA: Diagnosis not present

## 2023-09-13 DIAGNOSIS — S065X9A Traumatic subdural hemorrhage with loss of consciousness of unspecified duration, initial encounter: Secondary | ICD-10-CM | POA: Insufficient documentation

## 2023-09-13 DIAGNOSIS — I609 Nontraumatic subarachnoid hemorrhage, unspecified: Secondary | ICD-10-CM

## 2023-09-13 DIAGNOSIS — S060XAA Concussion with loss of consciousness status unknown, initial encounter: Secondary | ICD-10-CM

## 2023-09-13 DIAGNOSIS — S060X1A Concussion with loss of consciousness of 30 minutes or less, initial encounter: Secondary | ICD-10-CM | POA: Diagnosis not present

## 2023-09-13 DIAGNOSIS — R000 Tachycardia, unspecified: Secondary | ICD-10-CM | POA: Diagnosis not present

## 2023-09-13 DIAGNOSIS — S065XAA Traumatic subdural hemorrhage with loss of consciousness status unknown, initial encounter: Secondary | ICD-10-CM

## 2023-09-13 LAB — BASIC METABOLIC PANEL
Anion gap: 13 (ref 5–15)
BUN: 34 mg/dL — ABNORMAL HIGH (ref 8–23)
CO2: 26 mmol/L (ref 22–32)
Calcium: 9.9 mg/dL (ref 8.9–10.3)
Chloride: 92 mmol/L — ABNORMAL LOW (ref 98–111)
Creatinine, Ser: 1.28 mg/dL — ABNORMAL HIGH (ref 0.44–1.00)
GFR, Estimated: 42 mL/min — ABNORMAL LOW (ref >60)
Glucose, Bld: 120 mg/dL — ABNORMAL HIGH (ref 70–99)
Potassium: 4.7 mmol/L (ref 3.5–5.1)
Sodium: 131 mmol/L — ABNORMAL LOW (ref 135–145)

## 2023-09-13 LAB — CBC
HCT: 37.5 % (ref 36.0–46.0)
Hemoglobin: 12.6 g/dL (ref 12.0–15.0)
MCH: 29.1 pg (ref 26.0–34.0)
MCHC: 33.6 g/dL (ref 30.0–36.0)
MCV: 86.6 fL (ref 80.0–100.0)
Platelets: 391 10*3/uL (ref 150–400)
RBC: 4.33 MIL/uL (ref 3.87–5.11)
RDW: 13.7 % (ref 11.5–15.5)
WBC: 10.7 10*3/uL — ABNORMAL HIGH (ref 4.0–10.5)
nRBC: 0 % (ref 0.0–0.2)

## 2023-09-13 NOTE — ED Triage Notes (Signed)
Pt via POV from home. Pt was sent per MD orders because increased swelling and bleeding in her brain. Pt was seen here 9/28 for syncopal episode with head injury, dx with subdural bleed then and had a repeat CT today with worsening bleeding and swelling. Pt has been having headache, nausea, and dizziness since the fall. Pt is A&OX4 and NAD

## 2023-09-13 NOTE — ED Provider Notes (Addendum)
[facility] Provider Note   Event Date/Time   First MD Initiated Contact with Patient 09/13/23 1622     (approximate) History  Head Injury  HPI Brenda Landry is a 82 y.o. female seen in the subsequent encounter after a fall suffering bilateral subdural hematomas on 08/31/2023 who presents complaining of persistent nausea, headache, and lightheadedness as well as a repeat CT scan earlier today that showed mildly worsening subdural hematomas. ROS: Patient currently denies any vision changes, tinnitus, difficulty speaking, facial droop, sore throat, chest pain, shortness of breath, abdominal pain, nausea/vomiting/diarrhea, dysuria, or weakness/numbness/paresthesias in any extremity   Physical Exam  Triage Vital Signs: ED Triage Vitals  Encounter Vitals Group     BP 09/13/23 1608 132/66     Systolic BP Percentile --      Diastolic BP Percentile --      Pulse Rate 09/13/23 1608 (!) 108     Resp 09/13/23 1608 20     Temp 09/13/23 1608 98.2 F (36.8 C)     Temp Source 09/13/23 1608 Oral     SpO2 09/13/23 1608 98 %     Weight 09/13/23 1606 126 lb (57.2 kg)     Height 09/13/23 1606 5\' 3"  (1.6 m)     Head Circumference --      Peak Flow --      Pain Score 09/13/23 1606 5     Pain Loc --      Pain Education --      Exclude from Growth Chart --    Most recent vital signs: Vitals:   09/13/23 1608 09/13/23 1915  BP: 132/66 133/65  Pulse: (!) 108 90  Resp: 20 20  Temp: 98.2 F (36.8 C)   SpO2: 98% 100%   General: Awake, oriented x4. CV:  Good peripheral perfusion.  Resp:  Normal effort.  Abd:  No distention.  Other:  Elderly well-developed, well-nourished Caucasian female resting comfortably in no acute distress ED Results / Procedures / Treatments  Labs (all labs ordered are listed, but only abnormal results are displayed) Labs Reviewed  CBC - Abnormal; Notable for the following components:      Result Value   WBC 10.7 (*)    All other components  within normal limits  BASIC METABOLIC PANEL - Abnormal; Notable for the following components:   Sodium 131 (*)    Chloride 92 (*)    Glucose, Bld 120 (*)    BUN 34 (*)    Creatinine, Ser 1.28 (*)    GFR, Estimated 42 (*)    All other components within normal limits   EKG ED ECG REPORT I, Merwyn Katos, the attending physician, personally viewed and interpreted this ECG. Date: 09/13/2023 EKG Time: 1636 Rate: 104 Rhythm: Tachycardic sinus rhythm QRS Axis: normal Intervals: normal ST/T Wave abnormalities: normal Narrative Interpretation: Tachycardic sinus rhythm.  No evidence of acute ischemia RADIOLOGY ED MD interpretation: CT of the head without contrast interpreted by me shows no evidence of increase in interval size of bilateral low-attenuation subdural collections overlying the cerebral convexities. -Agree with radiology assessment Official radiology report(s): CT Head Wo Contrast  Result Date: 09/13/2023 CLINICAL DATA:  Stroke, follow-up. EXAM: CT HEAD WITHOUT CONTRAST TECHNIQUE: Contiguous axial images were obtained from the base of the skull through the vertex without intravenous contrast. RADIATION DOSE REDUCTION: This exam was performed according to the departmental dose-optimization program which includes automated exposure control, adjustment of the mA and/or kV according to patient size  and/or use of iterative reconstruction technique. COMPARISON:  Head CT 09/13/2023. 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 No acute hemorrhage. Gray-white differentiation is preserved. No hydrocephalus or midline shift. Vascular: No hyperdense vessel or unexpected calcification. Skull: No calvarial fracture or suspicious bone lesion. Skull base is unremarkable. Sinuses/Orbits: No acute finding. Other: None. 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 No acute hemorrhage. Electronically Signed   By: Orvan Falconer M.D.   On: 09/13/2023 20:39   CT HEAD WO CONTRAST ( )  Result Date: 09/13/2023 CLINICAL DATA:  Dizziness, headache EXAM: CT HEAD WITHOUT CONTRAST TECHNIQUE: Contiguous axial images were obtained from the base of the skull through the vertex without intravenous contrast. RADIATION DOSE REDUCTION: This exam was performed according to the departmental dose-optimization program which includes automated exposure control, adjustment of the mA and/or kV according to patient size and/or use of iterative reconstruction technique. COMPARISON:  09/01/2023 FINDINGS: Brain: Interval increase in size of bilateral low-attenuation subdural collections overlying the cerebral convexities. Left-sided collection measures up to 8 mm in size (series 4, image 29), previously 4 mm. Right-sided collection measures 5 mm (series 4, image 35), previously 4 mm. No hyperdense component within the extra-axial collections. No evidence of intraparenchymal or intraventricular hemorrhage. No evidence of acute infarction. No hydrocephalus, mass effect, or midline shift. Vascular: Atherosclerotic calcifications involving the large vessels of the skull base. No unexpected hyperdense vessel. Skull: Normal. Negative for fracture or focal lesion. Sinuses/Orbits: No acute finding. Other: None. IMPRESSION: 1. Slight interval increase in size of bilateral low-attenuation subdural collections overlying the cerebral convexities. No hyperdense component within the extra-axial collections. 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 2. No other new or acute intracranial abnormality. Electronically Signed   By: Duanne Guess D.O.   On: 09/13/2023 12:57   PROCEDURES: Critical Care performed:  No .1-3 Lead EKG Interpretation  Performed by: Merwyn Katos, MD Authorized by: Merwyn Katos, MD     Interpretation: normal     ECG rate:  91   ECG rate assessment: normal     Rhythm: sinus rhythm     Ectopy: none     Conduction: normal    MEDICATIONS ORDERED IN ED: Medications - No data to display IMPRESSION / MDM / ASSESSMENT AND PLAN / ED COURSE  I reviewed the triage vital signs and the nursing notes.                             The patient is on the cardiac monitor to evaluate for evidence of arrhythmia and/or significant heart rate changes. Patient's presentation is most consistent with acute presentation with potential threat to life or bodily function. Patient presents for bilateral subdural hematomas that seem to have grown in size since previous scanning as well as persistent symptoms of nausea, headache, and lightheadedness  Unlikely infectious etiology or changes secondary to ingestion  Workup: POCT glucose. CBC, BMP, PT/INR, PTT, troponin, type and screen ECG and non-contrast head CT  Findings: CT Brain: intracranial hemorrhage in the bilateral cerebral convexities ECG: No cerebral T waves. No STEMI  Interventions: Repeat head CT that did not show any worsening of these bilateral subdural hematomas  Consults:  Neurosurgery agrees with plan for outpatient follow-up given no interval increase on repeat head CT.  Patient encouraged to continue her Keppra and continue cessation of aspirin until follow-up with neurosurgery  Dispo: Discharge home with neurosurgical follow-up   FINAL CLINICAL IMPRESSION(S) / ED DIAGNOSES   Final diagnoses:  Subdural hematoma (HCC)  Concussion with unknown loss of consciousness status, initial encounter   Rx / DC Orders   ED Discharge Orders     None      Note:  This document was prepared using Dragon voice recognition software and may include unintentional dictation errors.   Merwyn Katos, MD 09/13/23 1610     Merwyn Katos, MD 09/13/23 [phone]

## 2023-09-13 NOTE — Telephone Encounter (Signed)
CT is being done today that was ordered by her PCP. Do you want her to have the one that you ordered to be done around 09/20/23?

## 2023-09-13 NOTE — Telephone Encounter (Signed)
-----   Message from Ridott B sent at 09/13/2023 11:19 AM EDT ----- Regarding: CT scan Patient's daughter called stating she is completing a CT scan today at another office; wanting to know if she still has to do one with Dr.Smith

## 2023-09-13 NOTE — ED Notes (Signed)
Lt green, purple, red, and blue lab tubes sent to lab at this time.

## 2023-09-13 NOTE — ED Notes (Signed)
Provider at bedside talking to Patient and family

## 2023-09-16 ENCOUNTER — Telehealth: Payer: Self-pay | Admitting: Neurosurgery

## 2023-09-16 NOTE — Telephone Encounter (Signed)
Patient was seen by her PCP on Friday and he ordered a CT scan which showed more bleeding.The patient was advised to go to the ER on Friday. The patient's daughter would like to know what the patient needs to do. She is scheduled for another CT on 09/20/2023. Please advise.

## 2023-09-16 NOTE — Telephone Encounter (Signed)
Pt had 2 CT scans on 09/13/23 and was in the ER on 09/13/23.

## 2023-09-17 NOTE — Telephone Encounter (Signed)
I notified pt's daughter that Dr Katrinka Blazing said the CT can be canceled, but that she should keep the appt with Dr Katrinka Blazing for her subdural and Dr Arlyss Queen for the aneurysm. She verbalized understanding. I have sent a message to April Pait in radiology scheduling to cancel the CT.

## 2023-09-20 ENCOUNTER — Ambulatory Visit: Payer: Medicare HMO

## 2023-09-27 DIAGNOSIS — C44612 Basal cell carcinoma of skin of right upper limb, including shoulder: Secondary | ICD-10-CM | POA: Diagnosis not present

## 2023-10-01 NOTE — Progress Notes (Unsigned)
Referring Physician:  No referring provider defined for this encounter.  Primary Physician:  Marguarite Arbour, MD  History of Present Illness: 10/01/2023 Ms. Brenda Landry is here today with a chief complaint of ***  fall suffering bilateral subdural hematomas on 08/31/2023  Nausea? Headaches?  Dizziness? Vision changes?    Brenda Landry has ***no symptoms of cervical myelopathy.  The symptoms are causing a significant impact on the patient's life.   I have utilized the care everywhere function in epic to review the outside records available from external health systems.  Review of Systems:  A 10 point review of systems is negative, except for the pertinent positives and negatives detailed in the HPI.  Past Medical History: Past Medical History:  Diagnosis Date   Breast cancer (HCC) 12/03/2005   lumpectomy, XRT, Arimadex   Colon polyps 12/04/2003   Depression    DM (diabetes mellitus) (HCC)    HTN (hypertension)    Hyperlipidemia    Osteoarthritis    Osteopenia    Personal history of radiation therapy 2007   Vitamin D deficiency     Past Surgical History: Past Surgical History:  Procedure Laterality Date   APPENDECTOMY     BREAST BIOPSY Left 07/16/2012   BENIGN BREAST PARENCHYMA WITH FIBROCYSTIC CHANGE AND USUAL DUCTAL HYPERPLASIA   BREAST LUMPECTOMY Right 12/03/2005   COLONOSCOPY N/A 08/29/2016   Procedure: COLONOSCOPY;  Surgeon: Corbin Ade, MD;  Location: [facility];  Service: Endoscopy;  Laterality: N/A;  12:00 pm   EYE SURGERY     cataract   POLYPECTOMY     TOTAL KNEE ARTHROPLASTY Right 01/31/2023   Procedure: TOTAL KNEE ARTHROPLASTY;  Surgeon: Reinaldo Berber, MD;  Location: [facility];  Service: Orthopedics;  Laterality: Right;   VAGINAL HYSTERECTOMY  12/03/2009   partial    Allergies: Allergies as of 10/02/2023 - Review Complete 09/13/2023  Allergen Reaction Noted   Codeine Itching 08/14/2016    Medications:  Current Outpatient  Medications:    acetaminophen (TYLENOL) 500 MG tablet, Take 1,000 mg by mouth every 6 (six) hours as needed for mild pain or moderate pain., Disp: , Rfl:    aspirin EC 81 MG tablet, Take 81 mg by mouth daily., Disp: , Rfl:    atorvastatin (LIPITOR) 40 MG tablet, Take 40 mg by mouth daily.  , Disp: , Rfl:    imipramine (TOFRANIL) 25 MG tablet, Take 25 mg by mouth at bedtime., Disp: , Rfl:    levETIRAcetam (KEPPRA) 500 MG tablet, Take 1 tablet (500 mg total) by mouth 2 (two) times daily for 7 days., Disp: 14 tablet, Rfl: 0   metFORMIN (GLUCOPHAGE) 500 MG tablet, Take 1,000 mg by mouth 2 (two) times daily with a meal., Disp: , Rfl:    omeprazole (PRILOSEC) 40 MG capsule, Take 40 mg by mouth daily., Disp: , Rfl:    traZODone (DESYREL) 50 MG tablet, Take 50 mg by mouth at bedtime., Disp: , Rfl:    triamterene-hydrochlorothiazide (DYAZIDE) 37.5-25 MG capsule, Take 1 capsule by mouth daily., Disp: , Rfl:    valsartan (DIOVAN) 320 MG tablet, Take 320 mg by mouth daily., Disp: , Rfl:   Social History: Social History   Tobacco Use   Smoking status: Never   Smokeless tobacco: Never  Substance Use Topics   Alcohol use: No   Drug use: No    Family Medical History: Family History  Problem Relation Age of Onset   Diabetes Mother    Stroke Mother  Heart disease Father    Prostate cancer Brother    Ovarian cancer Sister        female   Colon cancer Neg Hx    Liver disease Neg Hx     Physical Examination: There were no vitals filed for this visit.  General: Patient is in no apparent distress. Attention to examination is appropriate.  Neck:   Supple.  Full range of motion.  Respiratory: Patient is breathing without any difficulty.   NEUROLOGICAL:     Awake, alert, oriented to person, place, and time.  Speech is clear and fluent.   Cranial Nerves: Pupils equal round and reactive to light.  Facial tone is symmetric.  Facial sensation is symmetric. Shoulder shrug is symmetric. Tongue  protrusion is midline.    Strength: Side Biceps Triceps Deltoid Interossei Grip Wrist Ext. Wrist Flex.  R 5 5 5 5 5 5 5   L 5 5 5 5 5 5 5    Side Iliopsoas Quads Hamstring PF DF EHL  R 5 5 5 5 5 5   L 5 5 5 5 5 5    Reflexes are ***2+ and symmetric at the biceps, triceps, brachioradialis, patella and achilles.   Hoffman's is absent. Clonus is absent  Bilateral upper and lower extremity sensation is intact to light touch ***.     No evidence of dysmetria noted.  Gait is normal.    Imaging: *** I have personally reviewed the images and agree with the above interpretation.  Medical Decision Making/Assessment and Plan: Ms. Brenda Landry is a pleasant 82 y.o. female with ***  There are no diagnoses linked to this encounter.   Thank you for involving me in the care of this patient.    Lovenia Kim MD/MSCR Neurosurgery

## 2023-10-02 ENCOUNTER — Ambulatory Visit: Payer: Medicare HMO | Admitting: Neurosurgery

## 2023-10-02 ENCOUNTER — Encounter: Payer: Self-pay | Admitting: Neurosurgery

## 2023-10-02 VITALS — BP 134/72 | Ht 63.0 in | Wt 126.0 lb

## 2023-10-02 DIAGNOSIS — I671 Cerebral aneurysm, nonruptured: Secondary | ICD-10-CM

## 2023-10-02 DIAGNOSIS — I6203 Nontraumatic chronic subdural hemorrhage: Secondary | ICD-10-CM

## 2023-10-02 DIAGNOSIS — S065X1D Traumatic subdural hemorrhage with loss of consciousness of 30 minutes or less, subsequent encounter: Secondary | ICD-10-CM

## 2023-10-02 DIAGNOSIS — S066X1D Traumatic subarachnoid hemorrhage with loss of consciousness of 30 minutes or less, subsequent encounter: Secondary | ICD-10-CM

## 2023-10-02 DIAGNOSIS — W19XXXD Unspecified fall, subsequent encounter: Secondary | ICD-10-CM

## 2023-10-08 DIAGNOSIS — H524 Presbyopia: Secondary | ICD-10-CM | POA: Diagnosis not present

## 2023-10-10 DIAGNOSIS — I6203 Nontraumatic chronic subdural hemorrhage: Secondary | ICD-10-CM | POA: Diagnosis not present

## 2023-10-25 DIAGNOSIS — Z0000 Encounter for general adult medical examination without abnormal findings: Secondary | ICD-10-CM | POA: Diagnosis not present

## 2023-10-25 DIAGNOSIS — E785 Hyperlipidemia, unspecified: Secondary | ICD-10-CM | POA: Diagnosis not present

## 2023-10-25 DIAGNOSIS — Z79899 Other long term (current) drug therapy: Secondary | ICD-10-CM | POA: Diagnosis not present

## 2023-10-25 DIAGNOSIS — I10 Essential (primary) hypertension: Secondary | ICD-10-CM | POA: Diagnosis not present

## 2023-10-25 DIAGNOSIS — E119 Type 2 diabetes mellitus without complications: Secondary | ICD-10-CM | POA: Diagnosis not present

## 2023-11-08 DIAGNOSIS — Z79899 Other long term (current) drug therapy: Secondary | ICD-10-CM | POA: Diagnosis not present

## 2023-11-15 DIAGNOSIS — Z96651 Presence of right artificial knee joint: Secondary | ICD-10-CM | POA: Diagnosis not present

## 2023-12-06 DIAGNOSIS — H02882 Meibomian gland dysfunction right lower eyelid: Secondary | ICD-10-CM | POA: Diagnosis not present

## 2023-12-20 DIAGNOSIS — I10 Essential (primary) hypertension: Secondary | ICD-10-CM | POA: Diagnosis not present

## 2023-12-20 DIAGNOSIS — E782 Mixed hyperlipidemia: Secondary | ICD-10-CM | POA: Diagnosis not present

## 2023-12-20 DIAGNOSIS — Z79899 Other long term (current) drug therapy: Secondary | ICD-10-CM | POA: Diagnosis not present

## 2023-12-20 DIAGNOSIS — E119 Type 2 diabetes mellitus without complications: Secondary | ICD-10-CM | POA: Diagnosis not present

## 2024-02-20 DIAGNOSIS — I6203 Nontraumatic chronic subdural hemorrhage: Secondary | ICD-10-CM | POA: Diagnosis not present

## 2024-02-20 DIAGNOSIS — Z48811 Encounter for surgical aftercare following surgery on the nervous system: Secondary | ICD-10-CM | POA: Diagnosis not present

## 2024-02-20 DIAGNOSIS — I671 Cerebral aneurysm, nonruptured: Secondary | ICD-10-CM | POA: Diagnosis not present

## 2024-02-20 DIAGNOSIS — Z9889 Other specified postprocedural states: Secondary | ICD-10-CM | POA: Diagnosis not present

## 2024-02-20 DIAGNOSIS — I6200 Nontraumatic subdural hemorrhage, unspecified: Secondary | ICD-10-CM | POA: Diagnosis not present

## 2024-04-03 DIAGNOSIS — Z0000 Encounter for general adult medical examination without abnormal findings: Secondary | ICD-10-CM | POA: Diagnosis not present

## 2024-04-03 DIAGNOSIS — E119 Type 2 diabetes mellitus without complications: Secondary | ICD-10-CM | POA: Diagnosis not present

## 2024-04-03 DIAGNOSIS — I10 Essential (primary) hypertension: Secondary | ICD-10-CM | POA: Diagnosis not present

## 2024-04-03 DIAGNOSIS — Z79899 Other long term (current) drug therapy: Secondary | ICD-10-CM | POA: Diagnosis not present

## 2024-04-03 DIAGNOSIS — E785 Hyperlipidemia, unspecified: Secondary | ICD-10-CM | POA: Diagnosis not present

## 2024-06-25 ENCOUNTER — Other Ambulatory Visit (HOSPITAL_COMMUNITY): Payer: Self-pay | Admitting: Internal Medicine

## 2024-06-25 DIAGNOSIS — Z1231 Encounter for screening mammogram for malignant neoplasm of breast: Secondary | ICD-10-CM

## 2024-08-05 ENCOUNTER — Ambulatory Visit (HOSPITAL_COMMUNITY)
Admission: RE | Admit: 2024-08-05 | Discharge: 2024-08-05 | Disposition: A | Discharge status: Home / Self Care | Source: Ambulatory Visit | Attending: Internal Medicine | Admitting: Internal Medicine

## 2024-08-05 ENCOUNTER — Encounter (HOSPITAL_COMMUNITY): Payer: Self-pay

## 2024-08-05 DIAGNOSIS — Z1231 Encounter for screening mammogram for malignant neoplasm of breast: Secondary | ICD-10-CM | POA: Insufficient documentation

## 2024-08-18 DIAGNOSIS — E782 Mixed hyperlipidemia: Secondary | ICD-10-CM | POA: Diagnosis not present

## 2024-08-18 DIAGNOSIS — I10 Essential (primary) hypertension: Secondary | ICD-10-CM | POA: Diagnosis not present

## 2024-08-18 DIAGNOSIS — Z1331 Encounter for screening for depression: Secondary | ICD-10-CM | POA: Diagnosis not present

## 2024-08-18 DIAGNOSIS — Z0000 Encounter for general adult medical examination without abnormal findings: Secondary | ICD-10-CM | POA: Diagnosis not present

## 2024-08-18 DIAGNOSIS — E119 Type 2 diabetes mellitus without complications: Secondary | ICD-10-CM | POA: Diagnosis not present

## 2024-08-18 DIAGNOSIS — Z79899 Other long term (current) drug therapy: Secondary | ICD-10-CM | POA: Diagnosis not present

## 2024-08-28 DIAGNOSIS — Z79899 Other long term (current) drug therapy: Secondary | ICD-10-CM | POA: Diagnosis not present

## 2024-09-25 DIAGNOSIS — Z79899 Other long term (current) drug therapy: Secondary | ICD-10-CM | POA: Diagnosis not present

## 2024-10-16 DIAGNOSIS — E118 Type 2 diabetes mellitus with unspecified complications: Secondary | ICD-10-CM | POA: Diagnosis not present

## 2024-10-16 DIAGNOSIS — E875 Hyperkalemia: Secondary | ICD-10-CM | POA: Diagnosis not present

## 2024-10-16 DIAGNOSIS — R21 Rash and other nonspecific skin eruption: Secondary | ICD-10-CM | POA: Diagnosis not present

## 2024-10-16 DIAGNOSIS — Z79899 Other long term (current) drug therapy: Secondary | ICD-10-CM | POA: Diagnosis not present
